# Patient Record
Sex: Female | Born: 1975 | Race: White | Hispanic: No | Marital: Married | Smoking: Never smoker
Health system: Southern US, Community
[De-identification: ages and names within clinical notes are randomized; demographics above are authoritative.]

## PROBLEM LIST (undated history)

## (undated) DIAGNOSIS — I10 Essential (primary) hypertension: Secondary | ICD-10-CM

## (undated) DIAGNOSIS — T7840XA Allergy, unspecified, initial encounter: Secondary | ICD-10-CM

## (undated) HISTORY — DX: Allergy, unspecified, initial encounter: T78.40XA

## (undated) HISTORY — DX: Essential (primary) hypertension: I10

---

## 1997-10-27 ENCOUNTER — Encounter: Admission: RE | Admit: 1997-10-27 | Discharge: 1998-01-25 | Payer: Self-pay | Admitting: Obstetrics and Gynecology

## 1998-03-10 ENCOUNTER — Emergency Department (HOSPITAL_COMMUNITY): Admission: EM | Admit: 1998-03-10 | Discharge: 1998-03-10 | Payer: Self-pay | Admitting: Emergency Medicine

## 1998-11-13 ENCOUNTER — Ambulatory Visit (HOSPITAL_COMMUNITY): Admission: RE | Admit: 1998-11-13 | Discharge: 1998-11-13 | Payer: Self-pay | Admitting: Obstetrics and Gynecology

## 1998-11-20 ENCOUNTER — Ambulatory Visit (HOSPITAL_COMMUNITY): Admission: RE | Admit: 1998-11-20 | Discharge: 1998-11-20 | Payer: Self-pay | Admitting: Obstetrics and Gynecology

## 2003-04-21 ENCOUNTER — Other Ambulatory Visit: Admission: RE | Admit: 2003-04-21 | Discharge: 2003-04-21 | Payer: Self-pay | Admitting: Obstetrics and Gynecology

## 2004-04-27 ENCOUNTER — Other Ambulatory Visit: Admission: RE | Admit: 2004-04-27 | Discharge: 2004-04-27 | Payer: Self-pay | Admitting: Obstetrics and Gynecology

## 2005-05-13 ENCOUNTER — Other Ambulatory Visit: Admission: RE | Admit: 2005-05-13 | Discharge: 2005-05-13 | Payer: Self-pay | Admitting: Obstetrics and Gynecology

## 2006-06-27 ENCOUNTER — Other Ambulatory Visit: Admission: RE | Admit: 2006-06-27 | Discharge: 2006-06-27 | Payer: Self-pay | Admitting: Obstetrics and Gynecology

## 2006-12-31 ENCOUNTER — Inpatient Hospital Stay (HOSPITAL_COMMUNITY): Admission: AD | Admit: 2006-12-31 | Discharge: 2006-12-31 | Payer: Self-pay | Admitting: Obstetrics and Gynecology

## 2007-01-11 ENCOUNTER — Inpatient Hospital Stay (HOSPITAL_COMMUNITY): Admission: AD | Admit: 2007-01-11 | Discharge: 2007-01-12 | Payer: Self-pay | Admitting: Obstetrics and Gynecology

## 2007-01-15 ENCOUNTER — Inpatient Hospital Stay (HOSPITAL_COMMUNITY): Admission: AD | Admit: 2007-01-15 | Discharge: 2007-01-17 | Payer: Self-pay | Admitting: Obstetrics and Gynecology

## 2012-05-04 ENCOUNTER — Ambulatory Visit (INDEPENDENT_AMBULATORY_CARE_PROVIDER_SITE_OTHER): Payer: 59 | Admitting: General Surgery

## 2012-05-04 ENCOUNTER — Encounter (INDEPENDENT_AMBULATORY_CARE_PROVIDER_SITE_OTHER): Payer: Self-pay | Admitting: General Surgery

## 2012-05-04 VITALS — BP 112/80 | HR 67 | Temp 98.4°F | Resp 14 | Ht 68.0 in | Wt 163.4 lb

## 2012-05-04 DIAGNOSIS — K645 Perianal venous thrombosis: Secondary | ICD-10-CM

## 2012-05-04 HISTORY — DX: Perianal venous thrombosis: K64.5

## 2012-05-04 NOTE — Patient Instructions (Signed)
Use colace or miralax for constipation Warm tub soaks  Alternate tucks pads and ice packs to area

## 2012-05-17 ENCOUNTER — Encounter (INDEPENDENT_AMBULATORY_CARE_PROVIDER_SITE_OTHER): Payer: Self-pay | Admitting: General Surgery

## 2012-05-17 NOTE — Progress Notes (Signed)
Subjective:     Patient ID: Tami Johnson, female   DOB: [birth year], 36 y.o.   MRN: [id number]  HPI We are asked to see the patient in consultation by Dr. Christell Constant to evaluate her for hemorrhoids. The patient is a 36 year old female who has been experiencing pain at her rectum for the last week and a half. She has noticed a hard knot in the area. She denies any bleeding with her bowel movements. She does note that she has hard stools frequently.  Review of Systems  Constitutional: Negative.   HENT: Negative.   Eyes: Negative.   Respiratory: Negative.   Cardiovascular: Negative.   Gastrointestinal: Positive for rectal pain.  Genitourinary: Negative.   Musculoskeletal: Negative.   Skin: Negative.   Neurological: Negative.   Hematological: Negative.   Psychiatric/Behavioral: Negative.        Objective:   Physical Exam  Constitutional: She is oriented to person, place, and time. She appears well-developed and well-nourished.  HENT:  Head: Normocephalic and atraumatic.  Eyes: Conjunctivae and EOM are normal. Pupils are equal, round, and reactive to light.  Neck: Normal range of motion. Neck supple.  Cardiovascular: Normal rate, regular rhythm and normal heart sounds.   Pulmonary/Chest: Effort normal and breath sounds normal.  Abdominal: Soft. Bowel sounds are normal.  Genitourinary:       She does have what appears to be a resolving thrombosed external hemorrhoid. There is no skin breakdown.  Musculoskeletal: Normal range of motion.  Neurological: She is alert and oriented to person, place, and time.  Skin: Skin is warm and dry.  Psychiatric: She has a normal mood and affect. Her behavior is normal.       Assessment:     The patient has what appears to be a resolving thrombosed external hemorrhoid.    Plan:     At this point I think the clot we'll continue to break down and the hemorrhoid resolve on its own. I do not think there is any surgical intervention needed for this.  I do think fixing her constipation problems may help keep this from coming back. I recommended MiraLAX for constipation. I've also recommended alternating ice packs and tucked to the area to try to help the hemorrhoid resolve. We will plan to see her back on a when necessary basis

## 2012-06-28 ENCOUNTER — Other Ambulatory Visit: Payer: Self-pay | Admitting: Obstetrics and Gynecology

## 2012-06-28 DIAGNOSIS — N63 Unspecified lump in unspecified breast: Secondary | ICD-10-CM

## 2012-07-03 ENCOUNTER — Ambulatory Visit
Admission: RE | Admit: 2012-07-03 | Discharge: 2012-07-03 | Disposition: A | Discharge status: Home / Self Care | Payer: 59 | Source: Ambulatory Visit | Attending: Obstetrics and Gynecology | Admitting: Obstetrics and Gynecology

## 2012-07-03 DIAGNOSIS — N63 Unspecified lump in unspecified breast: Secondary | ICD-10-CM

## 2013-01-08 ENCOUNTER — Telehealth: Payer: Self-pay | Admitting: Nurse Practitioner

## 2013-01-08 NOTE — Telephone Encounter (Signed)
Advised patient that due to no appts being available she should go to urgent care

## 2013-01-09 ENCOUNTER — Ambulatory Visit (INDEPENDENT_AMBULATORY_CARE_PROVIDER_SITE_OTHER): Payer: 59 | Admitting: Family Medicine

## 2013-01-09 ENCOUNTER — Ambulatory Visit (INDEPENDENT_AMBULATORY_CARE_PROVIDER_SITE_OTHER): Payer: 59

## 2013-01-09 ENCOUNTER — Encounter: Payer: Self-pay | Admitting: Family Medicine

## 2013-01-09 VITALS — BP 136/82 | HR 65 | Temp 98.7°F | Wt 162.2 lb

## 2013-01-09 DIAGNOSIS — S60221A Contusion of right hand, initial encounter: Secondary | ICD-10-CM

## 2013-01-09 DIAGNOSIS — M79641 Pain in right hand: Secondary | ICD-10-CM

## 2013-01-09 DIAGNOSIS — M79609 Pain in unspecified limb: Secondary | ICD-10-CM

## 2013-01-09 DIAGNOSIS — S60229A Contusion of unspecified hand, initial encounter: Secondary | ICD-10-CM

## 2013-01-09 NOTE — Patient Instructions (Signed)
Elevate hand as much as possible. Ice for the rest of the day. Moist heat starting tomorrow Avoid any heavy lifting or straining Take ibuprofen if needed for pain

## 2013-01-09 NOTE — Progress Notes (Signed)
  Subjective:    Patient ID: Tami Johnson, female    DOB: 1975-10-23, 37 y.o.   MRN: [id number]  HPI Patient hit washing machine with her right fist two nites ago. She has a large bruise over the dorsal surface of the right hand especially  proximal to the third and fourth fingers. The area is slightly swollen. It has been painful to flex and extend the fingers of this hand.  Review of Systems  Musculoskeletal: Positive for arthralgias (R hand pain).  Skin: Positive for color change (bruising).       Objective:   Physical Exam Swelling and bruising of right dorsal hand [facility] reading (PRIMARY) by  Dr. Vernon Prey  normal x-rays of right hand                                    Assessment & Plan:  Contusion of right hand  Plan: Patient Instructions  Elevate hand as much as possible. Ice for the rest of the day. Moist heat starting tomorrow Avoid any heavy lifting or straining Take ibuprofen if needed for pain

## 2013-08-20 ENCOUNTER — Ambulatory Visit (INDEPENDENT_AMBULATORY_CARE_PROVIDER_SITE_OTHER): Payer: 59 | Admitting: Family Medicine

## 2013-08-20 ENCOUNTER — Encounter: Payer: Self-pay | Admitting: Family Medicine

## 2013-08-20 VITALS — BP 128/81 | HR 99 | Temp 98.9°F | Ht 68.0 in | Wt 168.0 lb

## 2013-08-20 DIAGNOSIS — J029 Acute pharyngitis, unspecified: Secondary | ICD-10-CM

## 2013-08-20 MED ORDER — AZITHROMYCIN 250 MG PO TABS: ORAL_TABLET | ORAL | Status: DC

## 2013-08-20 NOTE — Patient Instructions (Signed)

## 2013-08-20 NOTE — Progress Notes (Signed)
  Subjective:    Patient ID: Tami Johnson, female    DOB: [birth year], 37 y.o.   MRN: [id number]  HPI This 37 y.o. female presents for evaluation of sinus drainage and uri sx's for over a week.   Review of Systems    No chest pain, SOB, HA, dizziness, vision change, N/V, diarrhea, constipation, dysuria, urinary urgency or frequency, myalgias, arthralgias or rash.  Objective:   Physical Exam  Vital signs noted  Well developed well nourished female.  HEENT - Head atraumatic Normocephalic                Eyes - PERRLA, Conjuctiva - clear Sclera- Clear EOMI                Ears - EAC's Wnl TM's Wnl Gross Hearing WNL                Nose - Nares patent                 Throat - oropharanx wnl Respiratory - Lungs CTA bilateral Cardiac - RRR S1 and S2 without murmur GI - Abdomen soft Nontender and bowel sounds active x 4 Extremities - No edema. Neuro - Grossly intact.      Assessment & Plan:  Acute pharyngitis - Plan: azithromycin (ZITHROMAX) 250 MG tablet WSWG's, tylenol and motrin otc as directed. Push po fluids, rest, and follow up prn.  Deatra Canter FNP

## 2013-10-27 DIAGNOSIS — N63 Unspecified lump in unspecified breast: Secondary | ICD-10-CM | POA: Insufficient documentation

## 2013-11-04 ENCOUNTER — Other Ambulatory Visit: Payer: Self-pay | Admitting: Obstetrics and Gynecology

## 2013-11-04 DIAGNOSIS — N644 Mastodynia: Secondary | ICD-10-CM

## 2013-11-04 DIAGNOSIS — N6320 Unspecified lump in the left breast, unspecified quadrant: Principal | ICD-10-CM

## 2013-11-04 DIAGNOSIS — N6325 Unspecified lump in the left breast, overlapping quadrants: Secondary | ICD-10-CM

## 2013-11-11 ENCOUNTER — Ambulatory Visit
Admission: RE | Admit: 2013-11-11 | Discharge: 2013-11-11 | Disposition: A | Discharge status: Home / Self Care | Payer: 59 | Source: Ambulatory Visit | Attending: Obstetrics and Gynecology | Admitting: Obstetrics and Gynecology

## 2013-11-11 ENCOUNTER — Other Ambulatory Visit: Payer: Self-pay | Admitting: Obstetrics and Gynecology

## 2013-11-11 DIAGNOSIS — N6325 Unspecified lump in the left breast, overlapping quadrants: Secondary | ICD-10-CM

## 2013-11-11 DIAGNOSIS — N644 Mastodynia: Secondary | ICD-10-CM

## 2013-11-11 DIAGNOSIS — N6320 Unspecified lump in the left breast, unspecified quadrant: Principal | ICD-10-CM

## 2014-01-28 ENCOUNTER — Encounter: Payer: Self-pay | Admitting: Family Medicine

## 2014-01-28 ENCOUNTER — Ambulatory Visit (INDEPENDENT_AMBULATORY_CARE_PROVIDER_SITE_OTHER): Payer: 59 | Admitting: Family Medicine

## 2014-01-28 VITALS — BP 131/87 | HR 82 | Temp 98.2°F | Wt 166.8 lb

## 2014-01-28 DIAGNOSIS — J209 Acute bronchitis, unspecified: Secondary | ICD-10-CM

## 2014-01-28 MED ORDER — METHYLPREDNISOLONE ACETATE 80 MG/ML IJ SUSP
80.0000 mg | Freq: Once | INTRAMUSCULAR | Status: AC
  Administered 2014-01-28: 80 mg via INTRAMUSCULAR

## 2014-01-28 MED ORDER — BENZONATATE 100 MG PO CAPS
100.0000 mg | ORAL_CAPSULE | Freq: Three times a day (TID) | ORAL | Status: DC | PRN
Start: 2014-01-28 — End: 2014-08-25

## 2014-01-28 MED ORDER — AMOXICILLIN 875 MG PO TABS: 875.0000 mg | ORAL_TABLET | Freq: Two times a day (BID) | ORAL | Status: DC

## 2014-01-28 NOTE — Progress Notes (Signed)
   Subjective:    Patient ID: Tami Johnson, female    DOB: Sep 03, 1976, 38 y.o.   MRN: [id number]  HPI This 38 y.o. female presents for evaluation of cough and uri sx's for over a week.   Review of Systems C/o cough and uri sx's No chest pain, SOB, HA, dizziness, vision change, N/V, diarrhea, constipation, dysuria, urinary urgency or frequency, myalgias, arthralgias or rash.     Objective:   Physical Exam Vital signs noted  Well developed well nourished female.  HEENT - Head atraumatic Normocephalic                Eyes - PERRLA, Conjuctiva - clear Sclera- Clear EOMI                Ears - EAC's Wnl TM's Wnl Gross Hearing WNL                Throat - oropharanx wnl Respiratory - Lungs CTA bilateral Cardiac - RRR S1 and S2 without murmur GI - Abdomen soft Nontender and bowel sounds active x 4 Extremities - No edema. Neuro - Grossly intact.       Assessment & Plan:  Acute bronchitis - Plan: amoxicillin (AMOXIL) 875 MG tablet, benzonatate (TESSALON PERLES) 100 MG capsule, methylPREDNISolone acetate (DEPO-MEDROL) injection 80 mg  Push po fluids, rest, tylenol and motrin otc prn as directed for fever, arthralgias, and myalgias.  Follow up prn if sx's continue or persist.  Deatra CanterWilliam J Oxford FNP

## 2014-01-31 ENCOUNTER — Telehealth: Payer: Self-pay | Admitting: Family Medicine

## 2014-01-31 ENCOUNTER — Other Ambulatory Visit: Payer: Self-pay | Admitting: Family Medicine

## 2014-01-31 MED ORDER — FLUTICASONE PROPIONATE 50 MCG/ACT NA SUSP
2.0000 | Freq: Every day | NASAL | Status: DC
Start: 2014-01-31 — End: 2014-09-03

## 2014-01-31 NOTE — Telephone Encounter (Signed)
Sent flonase ns into pharmacy

## 2014-01-31 NOTE — Telephone Encounter (Signed)
Left message that Flonase was sent into pharmacy

## 2014-01-31 NOTE — Telephone Encounter (Signed)
flonase NS sent to pharmacy

## 2014-08-25 ENCOUNTER — Encounter: Payer: Self-pay | Admitting: *Deleted

## 2014-08-25 ENCOUNTER — Encounter: Payer: Self-pay | Admitting: Cardiology

## 2014-08-25 ENCOUNTER — Ambulatory Visit (INDEPENDENT_AMBULATORY_CARE_PROVIDER_SITE_OTHER): Payer: 59 | Admitting: Cardiology

## 2014-08-25 VITALS — BP 132/96 | HR 73 | Ht 68.0 in | Wt 172.0 lb

## 2014-08-25 DIAGNOSIS — R002 Palpitations: Secondary | ICD-10-CM

## 2014-08-25 DIAGNOSIS — I491 Atrial premature depolarization: Secondary | ICD-10-CM

## 2014-08-25 DIAGNOSIS — I10 Essential (primary) hypertension: Secondary | ICD-10-CM

## 2014-08-25 DIAGNOSIS — K645 Perianal venous thrombosis: Secondary | ICD-10-CM

## 2014-08-25 MED ORDER — METOPROLOL SUCCINATE ER 25 MG PO TB24: ORAL_TABLET | ORAL | Status: DC

## 2014-08-25 NOTE — Patient Instructions (Signed)
START TOPROL XL 25 MG 1/2 TABLET DAILY, RX SENT TO [facility]   AVOID CAFFEINE  AVOID SALT/SODIUM  Get OMRON blood pressure cuff and start monitoring blood pressure   A chest x-ray takes a picture of the organs and structures inside the chest, including the heart, lungs, and blood vessels. This test can show several things, including, whether the heart is enlarges; whether fluid is building up in the lungs; and whether pacemaker / defibrillator leads are still in place. [facility]  Your physician has requested that you have an echocardiogram. Echocardiography is a painless test that uses sound waves to create images of your heart. It provides your doctor with information about the size and shape of your heart and how well your heart's chambers and valves are working. This procedure takes approximately one hour. There are no restrictions for this procedure.  Your physician recommends that you schedule a follow-up appointment in: 1 MONTH OV

## 2014-08-25 NOTE — Progress Notes (Signed)
Tami LeydenKristal L Reesor Date of Birth:  24-Sep-1976 [facility] [address] [place], [place]  [id number] [phone]        Fax   [phone]   History of Present Illness: This pleasant 38 year old married woman is seen by me for the first time today.  She is seen at the request of Eveline KetoKathy Harris NP.  The patient is being seen because of the recent discovery of an irregular heartbeat.  This was noted during a routine GYN exam.  The patient herself has noted occasional palpitations but has not paid much attention to them.  She does note some occasional shortness of breath.  She has not been experiencing any chest discomfort.  She has had some mild dizziness which she attributes to a chronic ear condition.  For the past month she has been on a new oral contraceptive and wonders if that could be contributing to her dizziness.  The patient does not drink any alcohol.  She does not smoke.  She does drink an occasional diet Dr. Reino KentPepper which contains caffeine. Her family history reveals that her father has a history of high blood pressure but is alive at 5363.  Her mother is alive at 6861 and has no heart problems. Past medical history reveals that showing her first pregnancy the patient had high blood pressure as well as gestational diabetes and had significant weight gain.  She did not have any blood pressure problems with her second pregnancy.  Current Outpatient Prescriptions  Medication Sig Dispense Refill  . fluconazole (DIFLUCAN) 150 MG tablet Take 150 mg by mouth. Takes one every two weeks.    . fluticasone (FLONASE) 50 MCG/ACT nasal spray Place 2 sprays into both nostrils daily. 16 g 6  . FLUVIRIN 0.5 ML SUSY   0  . SPRINTEC 28 0.25-35 MG-MCG tablet     . metoprolol succinate (TOPROL XL) 25 MG 24 hr tablet 1/2 tablet daily 45 tablet 1   No current facility-administered medications for this visit.    Allergies  Allergen Reactions  . Ciprofloxacin Hives  . Sulfa Antibiotics  Hives    Patient Active Problem List   Diagnosis Date Noted  . PAC (premature atrial contraction) 08/25/2014  . Essential hypertension 08/25/2014  . Intermittent palpitations 08/25/2014  . External hemorrhoid, thrombosed 05/04/2012    History  Smoking status  . Never Smoker   Smokeless tobacco  . Not on file    History  Alcohol Use No    No family history on file.  Review of Systems: Constitutional: no fever chills diaphoresis or fatigue or change in weight.  Head and neck: no hearing loss, no epistaxis, no photophobia or visual disturbance. Respiratory: No cough, shortness of breath or wheezing. Cardiovascular: No chest pain peripheral edema, palpitations. Gastrointestinal: No abdominal distention, no abdominal pain, no change in bowel habits hematochezia or melena. Genitourinary: No dysuria, no frequency, no urgency, no nocturia. Musculoskeletal:No arthralgias, no back pain, no gait disturbance or myalgias. Neurological: No dizziness, no headaches, no numbness, no seizures, no syncope, no weakness, no tremors. Hematologic: No lymphadenopathy, no easy bruising. Psychiatric: No confusion, no hallucinations, no sleep disturbance.   Wt Readings from Last 3 Encounters:  08/25/14 172 lb (78.019 kg)  01/28/14 166 lb 12.8 oz (75.66 kg)  08/20/13 168 lb (76.204 kg)    Physical Exam: Filed Vitals:   08/25/14 1450  BP: 132/96  Pulse: 73   The patient appears to be in no distress.  Head and  neck exam reveals that the pupils are equal and reactive.  The extraocular movements are full.  There is no scleral icterus.  Mouth and pharynx are benign.  No lymphadenopathy.  No carotid bruits.  The jugular venous pressure is normal.  Thyroid is not enlarged or tender.  Chest is clear to percussion and auscultation.  No rales or rhonchi.  Expansion of the chest is symmetrical.  Heart reveals no abnormal lift or heave.  First and second heart sounds are normal.  There is no murmur  gallop rub or click.  Auscultation reveals occasional immature atrial beat.  The abdomen is soft and nontender.  Bowel sounds are normoactive.  There is no hepatosplenomegaly or mass.  There are no abdominal bruits.  Extremities reveal no phlebitis or edema.  Pedal pulses are good.  There is no cyanosis or clubbing.  Neurologic exam is normal strength and no lateralizing weakness.  No sensory deficits.  Integument reveals no rash  EKG shows normal sinus rhythm with occasional premature atrial beat, otherwise normal  Assessment / Plan: 1.  Sporadic premature atrial beats, minimally symptomatic. 2.  Mild hypertension  Disposition: For her blood pressure we have advised avoidance of salt and getting regular exercise.  We will also start her on a low dose of metoprolol succinate 12.5 mg daily.  She will obtain an blood pressure cuff for home use. In regard to her premature atrial beats, these do not in and of themselves require treatment.  However, the beta blocker that we put her on for her blood pressure should also help suppress the PACs.  To rule out any significant underlying structural disease we will get a chest x-ray and a 2-D echocardiogram. I have asked the patient to return in about a month for follow-up office visit. Many thanks for the opportunity to see this pleasant woman with you.  I will be in touch regarding the results of her studies.

## 2014-08-28 ENCOUNTER — Ambulatory Visit (INDEPENDENT_AMBULATORY_CARE_PROVIDER_SITE_OTHER): Payer: 59 | Admitting: Family Medicine

## 2014-08-28 VITALS — BP 142/80 | HR 71 | Temp 99.0°F | Ht 68.0 in | Wt 170.0 lb

## 2014-08-28 DIAGNOSIS — J069 Acute upper respiratory infection, unspecified: Secondary | ICD-10-CM

## 2014-08-28 MED ORDER — AZITHROMYCIN 250 MG PO TABS: ORAL_TABLET | ORAL | Status: DC

## 2014-08-28 MED ORDER — MOMETASONE FUROATE 50 MCG/ACT NA SUSP: 2.0000 | Freq: Every day | NASAL | Status: DC

## 2014-08-28 NOTE — Progress Notes (Signed)
   Subjective:    Patient ID: Tami Johnson, female    DOB: [birth year], 38 y.o.   MRN: [id number]  HPI Patient c/o ear discomfort and pain.  Review of Systems  Constitutional: Negative for fever.  HENT: Negative for ear pain.   Eyes: Negative for discharge.  Respiratory: Negative for cough.   Cardiovascular: Negative for chest pain.  Gastrointestinal: Negative for abdominal distention.  Endocrine: Negative for polyuria.  Genitourinary: Negative for difficulty urinating.  Musculoskeletal: Negative for gait problem and neck pain.  Skin: Negative for color change and rash.  Neurological: Negative for speech difficulty and headaches.  Psychiatric/Behavioral: Negative for agitation.       Objective:    BP 142/80 mmHg  Pulse 71  Temp(Src) 99 F (37.2 C) (Oral)  Ht 5\' 8"  (1.727 m)  Wt 170 lb (77.111 kg)  BMI 25.85 kg/m2 Physical Exam  Constitutional: She is oriented to person, place, and time. She appears well-developed and well-nourished.  HENT:  Head: Normocephalic and atraumatic.  Mouth/Throat: Oropharynx is clear and moist.  Eyes: Pupils are equal, round, and reactive to light.  Neck: Normal range of motion. Neck supple.  Cardiovascular: Normal rate and regular rhythm.   No murmur heard. Pulmonary/Chest: Effort normal and breath sounds normal.  Abdominal: Soft. Bowel sounds are normal. There is no tenderness.  Neurological: She is alert and oriented to person, place, and time.  Skin: Skin is warm and dry.  Psychiatric: She has a normal mood and affect.          Assessment & Plan:     ICD-9-CM ICD-10-CM   1. URI (upper respiratory infection) 465.9 J06.9 mometasone (NASONEX) 50 MCG/ACT nasal spray     azithromycin (ZITHROMAX) 250 MG tablet     No Follow-up on file.  Deatra CanterWilliam J Adalyne Lovick FNP

## 2014-08-29 ENCOUNTER — Ambulatory Visit (HOSPITAL_COMMUNITY): Payer: 59 | Attending: Cardiology | Admitting: Cardiology

## 2014-08-29 ENCOUNTER — Ambulatory Visit
Admission: RE | Admit: 2014-08-29 | Discharge: 2014-08-29 | Disposition: A | Discharge status: Home / Self Care | Payer: 59 | Source: Ambulatory Visit | Attending: Cardiology | Admitting: Cardiology

## 2014-08-29 ENCOUNTER — Telehealth: Payer: Self-pay | Admitting: Cardiology

## 2014-08-29 ENCOUNTER — Encounter: Payer: Self-pay | Admitting: *Deleted

## 2014-08-29 DIAGNOSIS — I10 Essential (primary) hypertension: Secondary | ICD-10-CM

## 2014-08-29 DIAGNOSIS — I491 Atrial premature depolarization: Secondary | ICD-10-CM | POA: Insufficient documentation

## 2014-08-29 DIAGNOSIS — R002 Palpitations: Secondary | ICD-10-CM | POA: Diagnosis not present

## 2014-08-29 NOTE — Progress Notes (Signed)
Echo performed. 

## 2014-08-29 NOTE — Telephone Encounter (Signed)
Left message to call back  

## 2014-08-29 NOTE — Telephone Encounter (Signed)
New Message       Pt calling stating she needs a note for work from her appt today. Pt needs one for herself and one for herself because her husband came with her to her appt. Please fax to [phone].

## 2014-09-01 NOTE — Telephone Encounter (Signed)
Follow up  ° ° ° °Returning call back to nurse  °

## 2014-09-01 NOTE — Telephone Encounter (Signed)
Faxed as requested

## 2014-09-03 ENCOUNTER — Other Ambulatory Visit: Payer: Self-pay | Admitting: Family Medicine

## 2014-09-03 ENCOUNTER — Telehealth: Payer: Self-pay | Admitting: *Deleted

## 2014-09-03 MED ORDER — FLUTICASONE PROPIONATE 50 MCG/ACT NA SUSP: 2.0000 | Freq: Every day | NASAL | Status: DC

## 2014-09-03 NOTE — Telephone Encounter (Signed)
flonase ns sent to pharm

## 2014-09-03 NOTE — Telephone Encounter (Signed)
Bill, ins will not cover nasonex spray unless she has tried flunisolide, nasocort otc or zetonna.If you will have her try one of these if it doesn't work I will resubmit the nasonex.

## 2014-09-08 ENCOUNTER — Telehealth: Payer: Self-pay | Admitting: Cardiology

## 2014-09-08 NOTE — Telephone Encounter (Signed)
Advised patient of echo  Patient stated that since having echo she has been having dizziness/lightheadedness when she lays on her left side, rolls over and subsides. Discussed with  Dr. Patty SermonsBrackbill and patient should follow up with PCP Advised patient

## 2014-09-08 NOTE — Telephone Encounter (Signed)
-----   Message from Cassell Clementhomas Brackbill, MD sent at 09/07/2014 10:03 PM EST ----- The echo is normal.  Please report.

## 2014-09-08 NOTE — Telephone Encounter (Signed)
New message      Pt want her test results.  Pt feels dizzy and lightheaded when she lies on her left side.

## 2014-09-22 ENCOUNTER — Ambulatory Visit (INDEPENDENT_AMBULATORY_CARE_PROVIDER_SITE_OTHER): Payer: 59 | Admitting: Cardiology

## 2014-09-22 ENCOUNTER — Encounter: Payer: Self-pay | Admitting: Cardiology

## 2014-09-22 VITALS — BP 142/90 | HR 68 | Ht 68.0 in | Wt 171.0 lb

## 2014-09-22 DIAGNOSIS — I491 Atrial premature depolarization: Secondary | ICD-10-CM

## 2014-09-22 DIAGNOSIS — I10 Essential (primary) hypertension: Secondary | ICD-10-CM

## 2014-09-22 DIAGNOSIS — R002 Palpitations: Secondary | ICD-10-CM

## 2014-09-22 MED ORDER — METOPROLOL SUCCINATE ER 25 MG PO TB24: 25.0000 mg | ORAL_TABLET | Freq: Every day | ORAL | Status: DC

## 2014-09-22 NOTE — Progress Notes (Signed)
Tami LeydenKristal L Johnson Date of Birth:  Oct 28, 1975 [facility] [address] [place], [place]  [id number] [phone]        Fax   [phone]   History of Present Illness: This pleasant 38 year old married woman is seen for a follow-up office visit.  She was initially seen at the request of Eveline KetoKathy Harris NP.  The patient was seen because of the recent discovery of an irregular heartbeat.  This was noted during a routine GYN exam.  The patient herself has noted occasional palpitations but has not paid much attention to them.  She does note some occasional shortness of breath.  She has not been experiencing any chest discomfort.  She has had some mild dizziness which she attributes to a chronic ear condition.  For the past month she has been on a new oral contraceptive and wonders if that could be contributing to her dizziness.  The patient does not drink any alcohol.  She does not smoke.  She does drink an occasional diet Dr. Reino KentPepper which contains caffeine. Her family history reveals that her father has a history of high blood pressure but is alive at 5763.  Her mother is alive at 461 and has no heart problems. Past medical history reveals that showing her first pregnancy the patient had high blood pressure as well as gestational diabetes and had significant weight gain.  She did not have any blood pressure problems with her second pregnancy. Since her last visit the patient underwent echocardiogram on 08/29/14 which was normal and showed an ejection fraction of 55-60%. She also had a chest x-ray on 08/29/2014 showing normal heart size and clear lungs.  Current Outpatient Prescriptions  Medication Sig Dispense Refill  . fluconazole (DIFLUCAN) 100 MG tablet Take 100 mg by mouth daily.    Marland Kitchen. guaiFENesin (MUCINEX) 600 MG 12 hr tablet 600 mg 2 (two) times daily as needed.    . metoprolol succinate (TOPROL XL) 25 MG 24 hr tablet Take 1 tablet (25 mg total) by mouth daily. 90 tablet 3  .  SPRINTEC 28 0.25-35 MG-MCG tablet      No current facility-administered medications for this visit.    Allergies  Allergen Reactions  . Ciprofloxacin Hives  . Sulfa Antibiotics Hives    Patient Active Problem List   Diagnosis Date Noted  . PAC (premature atrial contraction) 08/25/2014  . Essential hypertension 08/25/2014  . Intermittent palpitations 08/25/2014  . External hemorrhoid, thrombosed 05/04/2012    History  Smoking status  . Never Smoker   Smokeless tobacco  . Not on file    History  Alcohol Use No    History reviewed. No pertinent family history.  Review of Systems: Constitutional: no fever chills diaphoresis or fatigue or change in weight.  Head and neck: no hearing loss, no epistaxis, no photophobia or visual disturbance. Respiratory: No cough, shortness of breath or wheezing. Cardiovascular: No chest pain peripheral edema, palpitations. Gastrointestinal: No abdominal distention, no abdominal pain, no change in bowel habits hematochezia or melena. Genitourinary: No dysuria, no frequency, no urgency, no nocturia. Musculoskeletal:No arthralgias, no back pain, no gait disturbance or myalgias. Neurological: No dizziness, no headaches, no numbness, no seizures, no syncope, no weakness, no tremors. Hematologic: No lymphadenopathy, no easy bruising. Psychiatric: No confusion, no hallucinations, no sleep disturbance.   Wt Readings from Last 3 Encounters:  09/22/14 171 lb (77.565 kg)  08/28/14 170 lb (77.111 kg)  08/25/14 172 lb (78.019 kg)    Physical  Exam: Filed Vitals:   09/22/14 1525  BP: 142/90  Pulse: 68   The patient appears to be in no distress.  Head and neck exam reveals that the pupils are equal and reactive.  The extraocular movements are full.  There is no scleral icterus.  Mouth and pharynx are benign.  No lymphadenopathy.  No carotid bruits.  The jugular venous pressure is normal.  Thyroid is not enlarged or tender.  Chest is clear to  percussion and auscultation.  No rales or rhonchi.  Expansion of the chest is symmetrical.  Heart reveals no abnormal lift or heave.  First and second heart sounds are normal.  There is no murmur gallop rub or click.  Auscultation reveals occasional immature atrial beat.  The abdomen is soft and nontender.  Bowel sounds are normoactive.  There is no hepatosplenomegaly or mass.  There are no abdominal bruits.  Extremities reveal no phlebitis or edema.  Pedal pulses are good.  There is no cyanosis or clubbing.  Neurologic exam is normal strength and no lateralizing weakness.  No sensory deficits.  Integument reveals no rash  EKG shows normal sinus rhythm with occasional premature atrial beat, otherwise normal  Assessment / Plan: 1.  Sporadic premature atrial beats, minimally symptomatic. 2.  Mild hypertension, improved  Disposition: The patient brought in a list of her daily blood pressures which have improved.  Her diastolic blood pressure today is slightly high.  We will increase her Toprol to a full 25 mg tablet daily. She is having pain in the right ear.  I looked in her ear.  The eardrum is not red.  She will try Mucinex plain for head congestion.  She has an appointment on December 30 at her PCP to evaluate further. Recheck here in 6 months for follow-up office visit

## 2014-09-22 NOTE — Patient Instructions (Signed)
INCREASE YOUR TOPROL XL 25 MG TO ONE DAILY   Try Mucinex plain over the counter twice a day as needed for congestion  Your physician wants you to follow-up in: 6 MONTH OV You will receive a reminder letter in the mail two months in advance. If you don't receive a letter, please call our office to schedule the follow-up appointment.

## 2015-02-16 ENCOUNTER — Encounter: Payer: Self-pay | Admitting: Family Medicine

## 2015-02-16 ENCOUNTER — Telehealth: Payer: Self-pay | Admitting: Gastroenterology

## 2015-02-16 ENCOUNTER — Ambulatory Visit (INDEPENDENT_AMBULATORY_CARE_PROVIDER_SITE_OTHER): Payer: 59 | Admitting: Family

## 2015-02-16 ENCOUNTER — Encounter (INDEPENDENT_AMBULATORY_CARE_PROVIDER_SITE_OTHER): Payer: Self-pay

## 2015-02-16 ENCOUNTER — Encounter: Payer: Self-pay | Admitting: Family

## 2015-02-16 VITALS — BP 140/74 | HR 77 | Temp 98.1°F | Resp 16 | Wt 172.0 lb

## 2015-02-16 DIAGNOSIS — K645 Perianal venous thrombosis: Secondary | ICD-10-CM | POA: Diagnosis not present

## 2015-02-16 MED ORDER — TRAMADOL HCL 50 MG PO TABS: 50.0000 mg | ORAL_TABLET | Freq: Three times a day (TID) | ORAL | Status: DC | PRN

## 2015-02-16 NOTE — Patient Instructions (Signed)
Hemorrhoid Banding Hemorrhoids are veins in the anus and lower rectum that become enlarged. The most common symptoms are rectal bleeding, itching, and sometimes pain. Hemorrhoids might come out with straining or having a bowel movement, and they can sometimes be pushed back in. There are internal and external hemorrhoids. Only internal hemorrhoids can be treated with banding. In this procedure, a rubber band is placed near the hemorrhoid tissue, cutting off the blood supply. This procedure prevents the hemorrhoids from slipping down. LET YOUR CAREGIVER KNOW ABOUT: All medicines you are taking, especially blood thinners such as aspirin and coumadin.  RISKS AND COMPLICATIONS This is not a painful procedure, but if you do have intense pain immediately let your surgeon know because the band may need to be removed. You may have some mild pain or discomfort in the first 2 days or so after treatment. Sometimes there may be delayed bleeding in the first week after treatment.  BEFORE THE PROCEDURE  There is no special preparation needed before banding. Your surgeon may have you do an enema prior to the procedure. You will go home the same day.  HOME CARE INSTRUCTIONS   Your surgeon might instruct you to do sitz baths as needed if you have discomfort or after a bowel movement.  You may be instructed to use fiber supplements. SEEK MEDICAL CARE IF:  You have an increase in pain.  Your pain does not get better. SEEK IMMEDIATE MEDICAL CARE IF:  You have intense pain.  Fever greater than 100.5 F (38.1 C).  Bleeding that does not stop, or pus from the anus. Document Released: 07/10/2009 Document Revised: 12/05/2011 Document Reviewed: 07/10/2009 [facility] Patient Information 2015 [place], [place]. This information is not intended to replace advice given to you by your health care provider. Make sure you discuss any questions you have with your health care provider. About Hemorrhoids  Hemorrhoids are  swollen veins in the lower rectum and anus.  Also called piles, hemorrhoids are a common problem.  Hemorrhoids may be internal (inside the rectum) or external (around the anus).  Internal Hemorrhoids  Internal hemorrhoids are often painless, but they rarely cause bleeding.  The internal veins may stretch and fall down (prolapse) through the anus to the outside of the body.  The veins may then become irritated and painful.  External Hemorrhoids  External hemorrhoids can be easily seen or felt around the anal opening.  They are under the skin around the anus.  When the swollen veins are scratched or broken by straining, rubbing or wiping they sometimes bleed.  How Hemorrhoids Occur  Veins in the rectum and around the anus tend to swell under pressure.  Hemorrhoids can result from increased pressure in the veins of your anus or rectum.  Some sources of pressure are:   Straining to have a bowel movement because of constipation  Waiting too long to have a bowel movement  Coughing and sneezing often  Sitting for extended periods of time, including on the toilet  Diarrhea  Obesity  Trauma or injury to the anus  Some liver diseases  Stress  Family history of hemorrhoids  Pregnancy  Pregnant women should try to avoid becoming constipated, because they are more likely to have hemorrhoids during pregnancy.  In the last trimester of pregnancy, the enlarged uterus may press on blood vessels and causes hemorrhoids.  In addition, the strain of childbirth sometimes causes hemorrhoids after the birth.  Symptoms of Hemorrhoids  Some symptoms of hemorrhoids include:  Swelling and/or  a tender lump around the anus  Itching, mild burning and bleeding around the anus  Painful bowel movements with or without constipation  Bright red blood covering the stool, on toilet paper or in the toilet bowel.   Symptoms usually go away within a few days.  Always talk to your doctor about any bleeding  to make sure it is not from some other causes.  Diagnosing and Treating Hemorrhoids  Diagnosis is made by an examination by your healthcare provider.  Special test can be performed by your doctor.    Most cases of hemorrhoids can be treated with:  High-fiber diet: Eat more high-fiber foods, which help prevent constipation.  Ask for more detailed fiber information on types and sources of fiber from your healthcare provider.  Fluids: Drink plenty of water.  This helps soften bowel movements so they are easier to pass.  Sitz baths and cold packs: Sitting in lukewarm water two or three times a day for 15 minutes cleases the anal area and may relieve discomfort.  If the water is too hot, swelling around the anus will get worse.  Placing a cloth-covered ice pack on the anus for ten minutes four times a day can also help reduce selling.  Gently pushing a prolapsed hemorrhoid back inside after the bath or ice pack can be helpful.  Medications: For mild discomfort, your healthcare provider may suggest over-the-counter pain medication or prescribe a cream or ointment for topical use.  The cream may contain witch hazel, zinc oxide or petroleum jelly.  Medicated suppositories are also a treatment option.  Always consult your doctor before applying medications or creams.  Procedures and surgeries: There are also a number of procedures and surgeries to shrink or remove hemorrhoids in more serious cases.  Talk to your physician about these options.  You can often prevent hemorrhoids or keep them from becoming worse by maintaining a healthy lifestyle.  Eat a fiber-rich diet of fruits, vegetables and whole grains.  Also, drink plenty of water and exercise regularly.   2007, [employer] Doc.30

## 2015-02-16 NOTE — Progress Notes (Signed)
   Subjective:    Patient ID: Tami Johnson, female    DOB: Jun 17, 1976, 39 y.o.   MRN: [id number]  HPI Pt presents to the office today with complaints of hemorrhoid that started Saturday. Pt states she has had these in past several times and has used hydrocortisone cream and suppositories with relief. Pt states she has been using these since Saturday with no relief. Pt states constant shooting pain of 10 out of 10. Pt states any straining makes it worse. Pt states she has taken Motrin with mild relief.     Review of Systems  Constitutional: Negative.   HENT: Negative.   Eyes: Negative.   Respiratory: Negative.  Negative for shortness of breath.   Cardiovascular: Negative.  Negative for palpitations.  Gastrointestinal: Negative.   Endocrine: Negative.   Genitourinary: Negative.   Musculoskeletal: Negative.   Neurological: Negative.  Negative for headaches.  Hematological: Negative.   Psychiatric/Behavioral: Negative.   All other systems reviewed and are negative.      Objective:   Physical Exam  Constitutional: She is oriented to person, place, and time. She appears well-developed and well-nourished. She appears distressed.  Pt grimacing and laying down related to pain  HENT:  Head: Normocephalic and atraumatic.  Eyes: Pupils are equal, round, and reactive to light.  Neck: Normal range of motion. Neck supple. No thyromegaly present.  Cardiovascular: Normal rate, regular rhythm, normal heart sounds and intact distal pulses.   No murmur heard. Pulmonary/Chest: Effort normal and breath sounds normal. No respiratory distress. She has no wheezes.  Abdominal: Soft. Bowel sounds are normal. She exhibits no distension. There is no tenderness.  Genitourinary: Rectal exam shows internal hemorrhoid (Grade 4) and tenderness.  Musculoskeletal: Normal range of motion. She exhibits no edema or tenderness.  Neurological: She is alert and oriented to person, place, and time. She has normal  reflexes. No cranial nerve deficit.  Skin: Skin is warm and dry.  Psychiatric: She has a normal mood and affect. Her behavior is normal. Judgment and thought content normal.  Vitals reviewed.   BP 140/74 mmHg  Pulse 77  Temp(Src) 98.1 F (36.7 C) (Oral)  Resp 16  Wt 172 lb (78.019 kg)       Assessment & Plan:  1. External hemorrhoid, thrombosed -Diet discussed -Continue suppositories - Avoid straining or pushing -RTO prn - Ambulatory referral to Gastroenterology- Sent urgently - traMADol (ULTRAM) 50 MG tablet; Take 1 tablet (50 mg total) by mouth every 8 (eight) hours as needed.  Dispense: 30 tablet; Refill: 0  Jannifer Rodneyhristy Jessen Siegman, FNP

## 2015-02-16 NOTE — Telephone Encounter (Signed)
Spoke with Eunice BlaseDebbie and let her know the pt needs to be referred to [facility].

## 2015-03-24 ENCOUNTER — Ambulatory Visit: Payer: Self-pay | Admitting: Cardiology

## 2015-04-09 ENCOUNTER — Ambulatory Visit (INDEPENDENT_AMBULATORY_CARE_PROVIDER_SITE_OTHER): Payer: BLUE CROSS/BLUE SHIELD | Admitting: Cardiology

## 2015-04-09 ENCOUNTER — Encounter: Payer: Self-pay | Admitting: Cardiology

## 2015-04-09 VITALS — BP 130/88 | HR 69 | Ht 68.0 in | Wt 172.0 lb

## 2015-04-09 DIAGNOSIS — I10 Essential (primary) hypertension: Secondary | ICD-10-CM

## 2015-04-09 DIAGNOSIS — I491 Atrial premature depolarization: Secondary | ICD-10-CM | POA: Diagnosis not present

## 2015-04-09 MED ORDER — METOPROLOL SUCCINATE ER 25 MG PO TB24: 25.0000 mg | ORAL_TABLET | Freq: Every day | ORAL | Status: DC

## 2015-04-09 NOTE — Patient Instructions (Signed)
Medication Instructions:  Your physician recommends that you continue on your current medications as directed. Please refer to the Current Medication list given to you today.  Labwork: NONE  Testing/Procedures: NONE  Follow-Up: Your physician wants you to follow-up in: 1 YEAR OV/EKG  You will receive a reminder letter in the mail two months in advance. If you don't receive a letter, please call our office to schedule the follow-up appointment.    

## 2015-04-09 NOTE — Progress Notes (Signed)
Cardiology Office Note   Date:  04/09/2015   ID:  Tami Johnson, DOB 03-04-1976, MRN [id number]  PCP:  Rudi Heap, MD  Cardiologist: Cassell Clement MD  Chief Complaint  Patient presents with  . HYPERTENSION      History of Present Illness: Tami Johnson is a 39 y.o. female who presents for 6 month follow-up office visit.  Marland Kitchen She was initially seen at the request of Eveline Keto NP. The patient was seen because of the recent discovery of an irregular heartbeat. This was noted during a routine GYN exam. The patient herself has noted occasional palpitations but has not paid much attention to them. She does note some occasional shortness of breath. She has not been experiencing any chest discomfort. She has not been having any dizziness.  No peripheral edema. Since we increased her dose of Toprol to a full 25 mg tablet each day her palpitations have improved further.. She works fixing hospital instruments.  She does not get much intentional exercise. Her family history reveals that her father has a history of high blood pressure but is alive at 21. Her mother is alive at 58 and has no heart problems. Past medical history reveals that showing her first pregnancy the patient had high blood pressure as well as gestational diabetes and had significant weight gain. She did not have any blood pressure problems with her second pregnancy. Since her last visit the patient underwent echocardiogram on 08/29/14 which was normal and showed an ejection fraction of 55-60%. She also had a chest x-ray on 08/29/2014 showing normal heart size and clear lungs.  History reviewed. No pertinent past medical history.  History reviewed. No pertinent past surgical history.   Current Outpatient Prescriptions  Medication Sig Dispense Refill  . fluconazole (DIFLUCAN) 100 MG tablet Take 100 mg by mouth daily.    Marland Kitchen ibuprofen (ADVIL,MOTRIN) 200 MG tablet Take 200 mg by mouth as needed.     .  metoprolol succinate (TOPROL XL) 25 MG 24 hr tablet Take 1 tablet (25 mg total) by mouth daily. 90 tablet 3  . SPRINTEC 28 0.25-35 MG-MCG tablet      No current facility-administered medications for this visit.    Allergies:   Ciprofloxacin; Sulfa antibiotics; and Sulfamethoxazole-trimethoprim    Social History:  The patient  reports that she has never smoked. She does not have any smokeless tobacco history on file. She reports that she does not drink alcohol or use illicit drugs.   Family History:  The patient's family history includes Hypertension in her father. There is no history of Heart attack or Stroke.    ROS:  Please see the history of present illness.   Otherwise, review of systems are positive for none.   All other systems are reviewed and negative.    PHYSICAL EXAM: VS:  BP 130/88 mmHg  Pulse 69  Ht  (1.727 m)  Wt 172 lb (78.019 kg)  BMI 26.16 kg/m2  SpO2 96% , BMI Body mass index is 26.16 kg/(m^2). GEN: Well nourished, well developed, in no acute distress HEENT: normal Neck: no JVD, carotid bruits, or masses Cardiac: RRR; no murmurs, rubs, or gallops,no edema  Respiratory:  clear to auscultation bilaterally, normal work of breathing GI: soft, nontender, nondistended, + BS MS: no deformity or atrophy Skin: warm and dry, no rash Neuro:  Strength and sensation are intact Psych: euthymic mood, full affect   EKG:  EKG is not ordered today.    Recent  Labs: No results found for requested labs within last 365 days.    Lipid Panel No results found for: CHOL, TRIG, HDL, CHOLHDL, VLDL, LDLCALC, LDLDIRECT    Wt Readings from Last 3 Encounters:  04/09/15 172 lb (78.019 kg)  02/16/15 172 lb (78.019 kg)  09/22/14 171 lb (77.565 kg)        ASSESSMENT AND PLAN:  Assessment / Plan: 1. Sporadic premature atrial beats, minimally symptomatic, improved on higher dose Toprol 25 mg daily. 2. Mild hypertension, improved   Current medicines are reviewed at  length with the patient today.  The patient does not have concerns regarding medicines.  The following changes have been made:  no change  Labs/ tests ordered today include:  No orders of the defined types were placed in this encounter.     Disposition: Continue same medication.  Recheck in one year for office visit and EKG.  Karie SchwalbeSigned, Haakon Titsworth MD 04/09/2015 4:19 PM    [facility] [address], [place], [place]  [address] Phone: [phone]; Fax: [phone]

## 2015-05-29 DIAGNOSIS — S62629B Displaced fracture of medial phalanx of unspecified finger, initial encounter for open fracture: Secondary | ICD-10-CM

## 2015-05-29 HISTORY — DX: Displaced fracture of middle phalanx of unspecified finger, initial encounter for open fracture: S62.629B

## 2015-06-10 HISTORY — PX: FINGER SURGERY: SHX640

## 2015-07-06 ENCOUNTER — Other Ambulatory Visit: Payer: 59 | Admitting: Pediatrics

## 2015-07-09 ENCOUNTER — Ambulatory Visit (INDEPENDENT_AMBULATORY_CARE_PROVIDER_SITE_OTHER): Payer: BLUE CROSS/BLUE SHIELD | Admitting: Pediatrics

## 2015-07-09 ENCOUNTER — Encounter: Payer: Self-pay | Admitting: Pediatrics

## 2015-07-09 VITALS — BP 130/78 | HR 88 | Temp 98.7°F | Ht 68.0 in | Wt 169.4 lb

## 2015-07-09 DIAGNOSIS — Z6825 Body mass index (BMI) 25.0-25.9, adult: Secondary | ICD-10-CM | POA: Insufficient documentation

## 2015-07-09 DIAGNOSIS — Z0000 Encounter for general adult medical examination without abnormal findings: Secondary | ICD-10-CM

## 2015-07-09 DIAGNOSIS — J3089 Other allergic rhinitis: Secondary | ICD-10-CM

## 2015-07-09 DIAGNOSIS — Z862 Personal history of diseases of the blood and blood-forming organs and certain disorders involving the immune mechanism: Secondary | ICD-10-CM

## 2015-07-09 DIAGNOSIS — I10 Essential (primary) hypertension: Secondary | ICD-10-CM

## 2015-07-09 MED ORDER — MOMETASONE FUROATE 50 MCG/ACT NA SUSP: 2.0000 | Freq: Every day | NASAL | Status: DC

## 2015-07-09 NOTE — Progress Notes (Signed)
Subjective:    Patient ID: Tami Johnson, female    DOB: October 25, 1975, 39 y.o.   MRN: [id number]  CC: annual exam  HPI: Tami Johnson is a 39 y.o. female presenting on 07/09/2015 for Annual Exam  Wants to make sure sugars going.  Pap smear due next year. BP was 140/90 at gynecologist BP at home was 143/97. Interested in getting off of OCP, thinking about uterine ablation. Has been on BP pills in the past.  Paternal GM had DM2, also had breast ca, dx at 38yo Has had borderline blood sugars in the past Has had a mammogram before, had a cyst, next at age 27yo No headaches or vision Having two weeks of intermittent sweats where her shirt gets wet at night Has pins in fingers   Relevant past medical, surgical, family and social history reviewed and updated as indicated. Interim medical history since our last visit reviewed. Allergies and medications reviewed and updated.   ROS: All systems negative other than what is in HPI  Past Medical History Patient Active Problem List   Diagnosis Date Noted  . BMI 25.0-25.9,adult 07/09/2015  . PAC (premature atrial contraction) 08/25/2014  . Essential hypertension 08/25/2014  . Intermittent palpitations 08/25/2014  . External hemorrhoid, thrombosed 05/04/2012    Current Outpatient Prescriptions  Medication Sig Dispense Refill  . doxycycline (VIBRA-TABS) 100 MG tablet   0  . fluconazole (DIFLUCAN) 100 MG tablet Take 100 mg by mouth daily.    Marland Kitchen ibuprofen (ADVIL,MOTRIN) 200 MG tablet Take 200 mg by mouth as needed.     . metoprolol succinate (TOPROL XL) 25 MG 24 hr tablet Take 1 tablet (25 mg total) by mouth daily. 90 tablet 3  . SPRINTEC 28 0.25-35 MG-MCG tablet     . traMADol (ULTRAM) 50 MG tablet   0  . mometasone (NASONEX) 50 MCG/ACT nasal spray Place 2 sprays into the nose daily. 17 g 12   No current facility-administered medications for this visit.       Objective:    BP 130/78 mmHg  Pulse 88  Temp(Src) 98.7 F  (37.1 C) (Oral)  Ht '5\' 8"'  (1.727 m)  Wt 169 lb 6.4 oz (76.839 kg)  BMI 25.76 kg/m2  Wt Readings from Last 3 Encounters:  07/09/15 169 lb 6.4 oz (76.839 kg)  04/09/15 172 lb (78.019 kg)  02/16/15 172 lb (78.019 kg)    Gen: NAD, alert, cooperative with exam, NCAT EYES: EOMI, no scleral injection or icterus ENT:  TMs pearly gray b/l, OP without erythema, normal thyroid, pale nasal turbinates LYMPH: no cervical LAD CV: NRRR, normal S1/S2, no murmur, distal pulses 2+ b/l Resp: CTABL, no wheezes, normal WOB Abd: +BS, soft, NTND. no guarding or organomegaly Ext: No edema, warm Neuro: Alert and oriented, strength equal b/l UE and LE, coordination grossly normal MSK: normal muscle bulk     Assessment & Plan:   Khandi was seen today for annual exam. Overall doing well, will start nasal steroid for allergic rhinitis. Checking labs today. UTD on HCM. Check BPs at home over next weeks, let me know if regularly elevated over 140/90. Has been on BP meds in the past, lost weight and was able to come back off of them. She is trying to get alternative to [facility] which could also be raising her BP. Encouraged healthy lifestyle changes, regular activity and healthy food choices.  Diagnoses and all orders for this visit:  Encounter for preventive health examination -  BMP8+EGFR -     Thyroid Panel With TSH -     Lipid panel -     CBC  Other allergic rhinitis -     mometasone (NASONEX) 50 MCG/ACT nasal spray; Place 2 sprays into the nose daily.  Essential hypertension  -     BMP8+EGFR  BMI 25.0-25.9,adult  History of anemia -     CBC   Follow up plan: 2 weeks for BP check  Assunta Found, MD [facility] 07/09/2015, 2:17 PM

## 2015-07-09 NOTE — Patient Instructions (Signed)
flonase versus nasonex, I sent in prescription for nasonex and flonase is over the counter

## 2015-07-10 LAB — CBC
Hematocrit: 39.8 % (ref 34.0–46.6)
Hemoglobin: 13.3 g/dL (ref 11.1–15.9)
MCH: 30.1 pg (ref 26.6–33.0)
MCHC: 33.4 g/dL (ref 31.5–35.7)
MCV: 90 fL (ref 79–97)
Platelets: 259 10*3/uL (ref 150–379)
RBC: 4.42 x10E6/uL (ref 3.77–5.28)
RDW: 13.7 % (ref 12.3–15.4)
WBC: 6.1 10*3/uL (ref 3.4–10.8)

## 2015-07-10 LAB — BMP8+EGFR
BUN / CREAT RATIO: 13 (ref 8–20)
BUN: 8 mg/dL (ref 6–20)
CHLORIDE: 103 mmol/L (ref 97–108)
CO2: 25 mmol/L (ref 18–29)
Calcium: 9.3 mg/dL (ref 8.7–10.2)
Creatinine, Ser: 0.61 mg/dL (ref 0.57–1.00)
GFR calc Af Amer: 133 mL/min/{1.73_m2} (ref >59)
GFR calc non Af Amer: 115 mL/min/{1.73_m2} (ref >59)
Glucose: 89 mg/dL (ref 65–99)
Potassium: 4.2 mmol/L (ref 3.5–5.2)
Sodium: 141 mmol/L (ref 134–144)

## 2015-07-10 LAB — LIPID PANEL
Chol/HDL Ratio: 4.2 ratio units (ref 0.0–4.4)
Cholesterol, Total: 233 mg/dL — ABNORMAL HIGH (ref 100–199)
HDL: 56 mg/dL (ref >39)
LDL CALC: 151 mg/dL — AB (ref 0–99)
Triglycerides: 129 mg/dL (ref 0–149)
VLDL Cholesterol Cal: 26 mg/dL (ref 5–40)

## 2015-07-10 LAB — THYROID PANEL WITH TSH
FREE THYROXINE INDEX: 2.2 (ref 1.2–4.9)
T3 Uptake Ratio: 23 % — ABNORMAL LOW (ref 24–39)
T4, Total: 9.5 ug/dL (ref 4.5–12.0)
TSH: 1.6 u[IU]/mL (ref 0.450–4.500)

## 2015-08-24 DIAGNOSIS — S62621D Displaced fracture of medial phalanx of left index finger, subsequent encounter for fracture with routine healing: Secondary | ICD-10-CM

## 2015-08-24 HISTORY — DX: Displaced fracture of middle phalanx of left index finger, subsequent encounter for fracture with routine healing: S62.621D

## 2015-11-23 ENCOUNTER — Ambulatory Visit (INDEPENDENT_AMBULATORY_CARE_PROVIDER_SITE_OTHER): Payer: BLUE CROSS/BLUE SHIELD | Admitting: Family Medicine

## 2015-11-23 ENCOUNTER — Encounter: Payer: Self-pay | Admitting: Family Medicine

## 2015-11-23 VITALS — BP 124/72 | HR 64 | Temp 98.7°F | Ht 68.0 in | Wt 169.8 lb

## 2015-11-23 DIAGNOSIS — J3089 Other allergic rhinitis: Secondary | ICD-10-CM

## 2015-11-23 DIAGNOSIS — J0100 Acute maxillary sinusitis, unspecified: Secondary | ICD-10-CM

## 2015-11-23 MED ORDER — CETIRIZINE HCL 10 MG PO TABS: 10.0000 mg | ORAL_TABLET | Freq: Every day | ORAL | Status: DC

## 2015-11-23 MED ORDER — PSEUDOEPHEDRINE-GUAIFENESIN ER 120-1200 MG PO TB12: 1.0000 | ORAL_TABLET | Freq: Two times a day (BID) | ORAL | Status: DC

## 2015-11-23 MED ORDER — MOMETASONE FUROATE 50 MCG/ACT NA SUSP: 2.0000 | Freq: Every day | NASAL | Status: DC

## 2015-11-23 MED ORDER — AMOXICILLIN-POT CLAVULANATE 875-125 MG PO TABS: 1.0000 | ORAL_TABLET | Freq: Two times a day (BID) | ORAL | Status: DC

## 2015-11-23 NOTE — Progress Notes (Signed)
Subjective:  Patient ID: Tami Johnson, female    DOB: Jan 26, 1976  Age: 40 y.o. MRN: [id number]  CC: Sinusitis   HPI Tami Johnson presents for Symptoms include congestion, facial pain, nasal congestion, no  fever, non productive cough, post nasal drip and sinus pressure with no fever, chills, night sweats or weight loss. Onset of symptoms was a few days ago, gradually worsening since that time. Pt.is drinking moderate amounts of fluids.  Lots of dust exposure. Also keeps a dog in the house in spite of allergy to dog. Uses Claritin & nasal spray.     History Tami Johnson has no past medical history on file.   She has past surgical history that includes Finger surgery (06/10/15).   Her family history includes Hypertension in her father. There is no history of Heart attack or Stroke.She reports that she has never smoked. She does not have any smokeless tobacco history on file. She reports that she does not drink alcohol or use illicit drugs.    ROS Review of Systems  Constitutional: Negative for fever, chills, activity change and appetite change.  HENT: Positive for congestion, postnasal drip, rhinorrhea and sinus pressure. Negative for ear discharge, ear pain, hearing loss, nosebleeds, sneezing and trouble swallowing.   Respiratory: Negative for chest tightness and shortness of breath.   Cardiovascular: Negative for chest pain and palpitations.  Skin: Negative for rash.    Objective:  BP 124/72 mmHg  Pulse 64  Temp(Src) 98.7 F (37.1 C) (Oral)  Ht  (1.727 m)  Wt 169 lb 12.8 oz (77.021 kg)  BMI 25.82 kg/m2  SpO2 99%  LMP 10/23/2015  BP Readings from Last 3 Encounters:  11/23/15 124/72  07/09/15 130/78  04/09/15 130/88    Wt Readings from Last 3 Encounters:  11/23/15 169 lb 12.8 oz (77.021 kg)  07/09/15 169 lb 6.4 oz (76.839 kg)  04/09/15 172 lb (78.019 kg)     Physical Exam  Constitutional: She appears well-developed and well-nourished.  HENT:  Head:  Normocephalic and atraumatic.  Right Ear: Tympanic membrane and external ear normal. No decreased hearing is noted.  Left Ear: Tympanic membrane and external ear normal. No decreased hearing is noted.  Nose: Mucosal edema present. Right sinus exhibits no frontal sinus tenderness. Left sinus exhibits no frontal sinus tenderness.  Mouth/Throat: No oropharyngeal exudate or posterior oropharyngeal erythema.  Neck: No Brudzinski's sign noted.  Pulmonary/Chest: Breath sounds normal. No respiratory distress.  Lymphadenopathy:       Head (right side): No preauricular adenopathy present.       Head (left side): No preauricular adenopathy present.       Right cervical: No superficial cervical adenopathy present.      Left cervical: No superficial cervical adenopathy present.     Lab Results  Component Value Date   WBC 6.1 07/09/2015   HCT 39.8 07/09/2015   PLT 259 07/09/2015   GLUCOSE 89 07/09/2015   CHOL 233* 07/09/2015   TRIG 129 07/09/2015   HDL 56 07/09/2015   LDLCALC 151* 07/09/2015   NA 141 07/09/2015   K 4.2 07/09/2015   CL 103 07/09/2015   CREATININE 0.61 07/09/2015   BUN 8 07/09/2015   CO2 25 07/09/2015   TSH 1.600 07/09/2015    Dg Chest 2 View  08/29/2014  CLINICAL DATA:  Premature atrial contraction EXAM: CHEST  2 VIEW COMPARISON:  None. FINDINGS: The heart size and mediastinal contours are within normal limits. Both lungs are clear. The visualized skeletal  structures are unremarkable. IMPRESSION: No active cardiopulmonary disease. Electronically Signed   By: Natasha Mead M.D.   On: 08/29/2014 09:42    Assessment & Plan:   Gianna was seen today for sinusitis.  Diagnoses and all orders for this visit:  Other allergic rhinitis -     mometasone (NASONEX) 50 MCG/ACT nasal spray; Place 2 sprays into the nose daily.  Acute maxillary sinusitis, recurrence not specified  Other orders -     Pseudoephedrine-Guaifenesin [phone] MG TB12; Take 1 tablet by mouth 2 (two) times  daily. For congestion -     amoxicillin-clavulanate (AUGMENTIN) 875-125 MG tablet; Take 1 tablet by mouth 2 (two) times daily. Take all of this medication -     cetirizine (ZYRTEC) 10 MG tablet; Take 1 tablet (10 mg total) by mouth daily.      I have discontinued Ms. Kirtz's SPRINTEC 28, fluconazole, doxycycline, and traMADol. I am also having her start on Pseudoephedrine-Guaifenesin, amoxicillin-clavulanate, and cetirizine. Additionally, I am having her maintain her ibuprofen, metoprolol succinate, and mometasone.  Meds ordered this encounter  Medications  . Pseudoephedrine-Guaifenesin [phone] MG TB12    Sig: Take 1 tablet by mouth 2 (two) times daily. For congestion    Dispense:  20 each    Refill:  0  . amoxicillin-clavulanate (AUGMENTIN) 875-125 MG tablet    Sig: Take 1 tablet by mouth 2 (two) times daily. Take all of this medication    Dispense:  20 tablet    Refill:  0  . cetirizine (ZYRTEC) 10 MG tablet    Sig: Take 1 tablet (10 mg total) by mouth daily.    Dispense:  30 tablet    Refill:  11  . mometasone (NASONEX) 50 MCG/ACT nasal spray    Sig: Place 2 sprays into the nose daily.    Dispense:  17 g    Refill:  12     Follow-up: Return if symptoms worsen or fail to improve.  Mechele Claude, M.D.

## 2015-11-24 ENCOUNTER — Telehealth: Payer: Self-pay | Admitting: Family Medicine

## 2015-11-24 NOTE — Telephone Encounter (Signed)
Letter printed and pt is aware to come pick up letter.

## 2015-12-14 ENCOUNTER — Other Ambulatory Visit: Payer: Self-pay | Admitting: Nurse Practitioner

## 2015-12-14 MED ORDER — VALACYCLOVIR HCL 1 G PO TABS: 1000.0000 mg | ORAL_TABLET | Freq: Two times a day (BID) | ORAL | Status: DC

## 2016-04-13 ENCOUNTER — Ambulatory Visit (INDEPENDENT_AMBULATORY_CARE_PROVIDER_SITE_OTHER): Payer: BLUE CROSS/BLUE SHIELD | Admitting: Family Medicine

## 2016-04-13 ENCOUNTER — Encounter: Payer: Self-pay | Admitting: Family Medicine

## 2016-04-13 VITALS — BP 120/81 | HR 64 | Temp 97.7°F | Ht 68.0 in | Wt 179.6 lb

## 2016-04-13 DIAGNOSIS — J0110 Acute frontal sinusitis, unspecified: Secondary | ICD-10-CM

## 2016-04-13 MED ORDER — METHYLPREDNISOLONE ACETATE 80 MG/ML IJ SUSP
80.0000 mg | Freq: Once | INTRAMUSCULAR | Status: AC
  Administered 2016-04-13: 80 mg via INTRAMUSCULAR

## 2016-04-13 MED ORDER — AMOXICILLIN-POT CLAVULANATE 875-125 MG PO TABS: 1.0000 | ORAL_TABLET | Freq: Two times a day (BID) | ORAL | Status: DC

## 2016-04-13 NOTE — Progress Notes (Signed)
   HPI  Patient presents today here with headache.  Patient explains that she's had headache, congestion, and left ear pain and muffled hearing over the last 2-3 days. She states symptoms started Sunday night and worsened throughout the day Monday. She has persistent frontal headache pain which radiates to her bilateral shoulders. She states this is not the worse headache she's ever had, it started insidiously and got worse from there.  It is not similar to previous sinus infections that she has a long history of sinus infections recurrently.  She is tolerating food and fluids normally, she denies fever, chills, sweats.  PMH: Smoking status noted ROS: Per HPI  Objective: BP 120/81 mmHg  Pulse 64  Temp(Src) 97.7 F (36.5 C) (Oral)  Ht 5\' 8"  (1.727 m)  Wt 179 lb 9.6 oz (81.466 kg)  BMI 27.31 kg/m2 Gen: NAD, alert, cooperative with exam HEENT: NCAT, EOMI, PERRL, tenderness to palpation of bilateral frontal sinuses, no tenderness to palpation of maxillary sinuses, scarring in left TM with preserved landmarks, right TM is normal also, left-sided turbinates swollen CV: RRR, good S1/S2, no murmur Resp: CTABL, no wheezes, non-labored Ext: No edema, warm Neuro: Alert and oriented, drink 5/5 and sensation intact in bilateral upper and lower extremities, 2+ patellar tendon reflexes bilaterally, EOMI, PERRLA, cranial nerves II through XII intact   Assessment and plan:  # Headache acute frontal sinusitis Treat with IM Depo-Medrol and Augmentin Given persistent headache for 2-3 days if she does not have quick relief with steroid injection and antibiotics have asked her to come back in 2 days. Low threshold for CT scan given persistent severe headache. After my exam she has tenderness of the sinuses and swollen turbinates making me feel that the most likely thing is sinusitis.      Meds ordered this encounter  Medications  . amoxicillin-clavulanate (AUGMENTIN) 875-125 MG tablet    Sig:  Take 1 tablet by mouth 2 (two) times daily.    Dispense:  20 tablet    Refill:  0  . methylPREDNISolone acetate (DEPO-MEDROL) injection 80 mg    Sig:     Murtis SinkSam Rolfe Hartsell, MD [facility] 04/13/2016, 9:57 AM

## 2016-04-13 NOTE — Patient Instructions (Signed)
Great to meet you!  I am treating you for a sinus infection, if you are not getting better within 2 days or if you are getting worse please come back.   Sinusitis, Adult Sinusitis is redness, soreness, and puffiness (inflammation) of the air pockets in the bones of your face (sinuses). The redness, soreness, and puffiness can cause air and mucus to get trapped in your sinuses. This can allow germs to grow and cause an infection.  HOME CARE   Drink enough fluids to keep your pee (urine) clear or pale yellow.  Use a humidifier in your home.  Run a hot shower to create steam in the bathroom. Sit in the bathroom with the door closed. Breathe in the steam 3-4 times a day.  Put a warm, moist washcloth on your face 3-4 times a day, or as told by your doctor.  Use salt water sprays (saline sprays) to wet the thick fluid in your nose. This can help the sinuses drain.  Only take medicine as told by your doctor. GET HELP RIGHT AWAY IF:   Your pain gets worse.  You have very bad headaches.  You are sick to your stomach (nauseous).  You throw up (vomit).  You are very sleepy (drowsy) all the time.  Your face is puffy (swollen).  Your vision changes.  You have a stiff neck.  You have trouble breathing. MAKE SURE YOU:   Understand these instructions.  Will watch your condition.  Will get help right away if you are not doing well or get worse.   This information is not intended to replace advice given to you by your health care provider. Make sure you discuss any questions you have with your health care provider.   Document Released: 02/29/2008 Document Revised: 10/03/2014 Document Reviewed: 04/17/2012 Elsevier Interactive Patient Education [employer]2016 Elsevier Inc.

## 2016-04-20 ENCOUNTER — Other Ambulatory Visit: Payer: Self-pay

## 2016-04-20 MED ORDER — METOPROLOL SUCCINATE ER 25 MG PO TB24: 25.0000 mg | ORAL_TABLET | Freq: Every day | ORAL | 3 refills | Status: DC

## 2016-04-20 NOTE — Telephone Encounter (Signed)
New message   *STAT* If patient is at the pharmacy, call can be transferred to refill team.   1. Which medications need to be refilled? (please list name of each medication and dose if known)Lotoprol 25mg   2. Which pharmacy/location (including street and city if local pharmacy) is medication to be sent to? [facility]- [phone]  3. Do they need a 30 day or 90 day supply? 30

## 2016-04-21 ENCOUNTER — Telehealth: Payer: Self-pay | Admitting: Cardiology

## 2016-04-21 NOTE — Telephone Encounter (Signed)
Returned pt call-pt reports Rx was suppose to be sent to pharmacy (Metoprolol succinate 25mg  24 hr tablet)-[facility] in [place].   [facility] seems to [facility] [place].  Pt verbalized that there was no need to send another prescription to [facility] that she would go to [facility] and get it.  Advised pt to call back if needed. Pt verbalized understanding.       Due for appt (former pt of [facility])-appt already scheduled for 8/9 with MD Hochrein.

## 2016-04-21 NOTE — Telephone Encounter (Signed)
New Message  Pt call requesting to speak with RN about a medication refill that was to be fill [facility]. [facility] not [facility] medication please call back to discuss

## 2016-05-03 NOTE — Progress Notes (Signed)
Cardiology Office Note   Date:  05/04/2016   ID:  Tami Johnson, DOB [birth year], MRN [id number]  PCP:  Johna Sheriff, MD  Cardiologist:   Rollene Rotunda, MD   Chief Complaint  Patient presents with  . PACs      History of Present Illness: Tami Johnson is a 40 y.o. female who presents for follow up of palpitations.  She has had documented PACs.  She has had a cardiac work up with an echo demonstrating a normal EF no significant valvular abnormalities.    She was previously seeing Dr. Patty Sermons.    She was noted to have palpitations during a routine exam. She has never really felt. She has been treated with beta blocker for both these for some high blood pressure. She says she does well. She stays active at home and has a job. The patient denies any new symptoms such as chest discomfort, neck or arm discomfort. There has been no new shortness of breath, PND or orthopnea. There have been no reported palpitations, presyncope or syncope.  No past medical history on file.  Past Surgical History:  Procedure Laterality Date  . FINGER SURGERY  06/10/15     Current Outpatient Prescriptions  Medication Sig Dispense Refill  . cetirizine (ZYRTEC) 10 MG tablet Take 1 tablet (10 mg total) by mouth daily. 30 tablet 11  . ibuprofen (ADVIL,MOTRIN) 200 MG tablet Take 200 mg by mouth as needed.     . Magnesium 200 MG TABS Take 1 tablet by mouth daily.    . metoprolol succinate (TOPROL XL) 25 MG 24 hr tablet Take 1 tablet (25 mg total) by mouth daily. 90 tablet 3   No current facility-administered medications for this visit.     Allergies:   Ciprofloxacin; Sulfa antibiotics; and Sulfamethoxazole-trimethoprim    ROS:  Please see the history of present illness.   Otherwise, review of systems are positive for none.   All other systems are reviewed and negative.    PHYSICAL EXAM: VS:  BP 138/86   Pulse (!) 54   Ht  (1.727 m)   Wt 179 lb 6.4 oz (81.4 kg)   BMI 27.28 kg/m   , BMI Body mass index is 27.28 kg/m. GENERAL:  Well appearing HEENT:  Pupils equal round and reactive, fundi not visualized, oral mucosa unremarkable NECK:  No jugular venous distention, waveform within normal limits, carotid upstroke brisk and symmetric, no bruits, no thyromegaly LYMPHATICS:  No cervical, inguinal adenopathy LUNGS:  Clear to auscultation bilaterally BACK:  No CVA tenderness CHEST:  Unremarkable HEART:  PMI not displaced or sustained,S1 and S2 within normal limits, no S3, no S4, no clicks, no rubs, no murmurs ABD:  Flat, positive bowel sounds normal in frequency in pitch, no bruits, no rebound, no guarding, no midline pulsatile mass, no hepatomegaly, no splenomegaly EXT:  2 plus pulses throughout, no edema, no cyanosis no clubbing SKIN:  No rashes no nodules NEURO:  Cranial nerves II through XII grossly intact, motor grossly intact throughout PSYCH:  Cognitively intact, oriented to person place and time    EKG:  EKG is ordered today. The ekg ordered today demonstrates sinus bradycardia, rate 54, axis within normal limits, intervals within normal limits, no acute ST-T wave changes.   Recent Labs: 07/09/2015: BUN 8; Creatinine, Ser 0.61; Platelets 259; Potassium 4.2; Sodium 141; TSH 1.600    Lipid Panel    Component Value Date/Time   CHOL 233 (H) 07/09/2015 1459  TRIG 129 07/09/2015 1459   HDL 56 07/09/2015 1459   CHOLHDL 4.2 07/09/2015 1459   LDLCALC 151 (H) 07/09/2015 1459      Wt Readings from Last 3 Encounters:  05/04/16 179 lb 6.4 oz (81.4 kg)  04/13/16 179 lb 9.6 oz (81.5 kg)  11/23/15 169 lb 12.8 oz (77 kg)      Other studies Reviewed: Additional studies/ records that were reviewed today include: none. Review of the above records demonstrates:  Please see elsewhere in the note.     ASSESSMENT AND PLAN:  PACs :   She's not having any symptoms from this. No further workup is planned. She can continue on the low dose beta blocker.  I suggested  that she come back and see me as needed and would ask her primary care team can renew her prescription future.  HTN:    Her blood pressure is well controlled. She will continue the meds as listed. I encouraged exercise.   Current medicines are reviewed at length with the patient today.  The patient does not have concerns regarding medicines.  The following changes have been made:  no change  Labs/ tests ordered today include:   Orders Placed This Encounter  Procedures  . EKG 12-Lead     Disposition:   FU with me as needed.      Signed, Rollene RotundaJames Ruhani Umland, MD  05/04/2016 5:10 PM    [facility]

## 2016-05-04 ENCOUNTER — Ambulatory Visit (INDEPENDENT_AMBULATORY_CARE_PROVIDER_SITE_OTHER): Payer: BLUE CROSS/BLUE SHIELD | Admitting: Cardiology

## 2016-05-04 ENCOUNTER — Encounter (INDEPENDENT_AMBULATORY_CARE_PROVIDER_SITE_OTHER): Payer: Self-pay

## 2016-05-04 ENCOUNTER — Encounter: Payer: Self-pay | Admitting: Cardiology

## 2016-05-04 VITALS — BP 138/86 | HR 54 | Ht 68.0 in | Wt 179.4 lb

## 2016-05-04 DIAGNOSIS — I10 Essential (primary) hypertension: Secondary | ICD-10-CM | POA: Diagnosis not present

## 2016-05-04 DIAGNOSIS — I491 Atrial premature depolarization: Secondary | ICD-10-CM

## 2016-05-04 MED ORDER — METOPROLOL SUCCINATE ER 25 MG PO TB24: 25.0000 mg | ORAL_TABLET | Freq: Every day | ORAL | 3 refills | Status: DC

## 2016-05-04 NOTE — Patient Instructions (Signed)
Your physician recommends that you schedule a follow-up appointment in: As Needed    

## 2016-08-16 ENCOUNTER — Ambulatory Visit (INDEPENDENT_AMBULATORY_CARE_PROVIDER_SITE_OTHER): Payer: BLUE CROSS/BLUE SHIELD | Admitting: Pediatrics

## 2016-08-16 ENCOUNTER — Encounter: Payer: Self-pay | Admitting: Pediatrics

## 2016-08-16 VITALS — BP 141/88 | HR 75 | Temp 99.1°F | Ht 68.0 in | Wt 184.0 lb

## 2016-08-16 DIAGNOSIS — H8112 Benign paroxysmal vertigo, left ear: Secondary | ICD-10-CM | POA: Diagnosis not present

## 2016-08-16 MED ORDER — MECLIZINE HCL 12.5 MG PO TABS: 12.5000 mg | ORAL_TABLET | Freq: Three times a day (TID) | ORAL | 0 refills | Status: DC | PRN

## 2016-08-16 MED ORDER — FLUTICASONE PROPIONATE 50 MCG/ACT NA SUSP: 1.0000 | Freq: Every day | NASAL | 2 refills | Status: DC

## 2016-08-16 NOTE — Patient Instructions (Addendum)
Epley Maneuver Self-Care  WHAT IS THE EPLEY MANEUVER?  The Epley maneuver is an exercise you can do to relieve symptoms of benign paroxysmal positional vertigo (BPPV). This condition is often just referred to as vertigo. BPPV is caused by the movement of tiny crystals (canaliths) inside your inner ear. The accumulation and movement of canaliths in your inner ear causes a sudden spinning sensation (vertigo) when you move your head to certain positions. Vertigo usually lasts about 30 seconds. BPPV usually occurs in just one ear. If you get vertigo when you lie on your left side, you probably have BPPV in your left ear. Your health care provider can tell you which ear is involved.   BPPV may be caused by a head injury. Many people older than 50 get BPPV for unknown reasons. If you have been diagnosed with BPPV, your health care provider may teach you how to do this maneuver. BPPV is not life threatening (benign) and usually goes away in time.   WHEN SHOULD I PERFORM THE EPLEY MANEUVER?  You can do this maneuver at home whenever you have symptoms of vertigo. You may do the Epley maneuver up to 3 times a day until your symptoms of vertigo go away.  HOW SHOULD I DO THE EPLEY MANEUVER?  1. Sit on the edge of a bed or table with your back straight. Your legs should be extended or hanging over the edge of the bed or table.    2. Turn your head halfway toward the affected ear.    3. Lie backward quickly with your head turned until you are lying flat on your back. You may want to position a pillow under your shoulders.    4. Hold this position for 30 seconds. You may experience an attack of vertigo. This is normal. Hold this position until the vertigo stops.  5. Then turn your head to the opposite direction until your unaffected ear is facing the floor.    6. Hold this position for 30 seconds. You may experience an attack of vertigo. This is normal. Hold this position until the vertigo stops.   7. Now turn your whole body to the same side as your head. Hold for another 30 seconds.    8. You can then sit back up.  ARE THERE RISKS TO THIS MANEUVER?  In some cases, you may have other symptoms (such as changes in your vision, weakness, or numbness). If you have these symptoms, stop doing the maneuver and call your health care provider. Even if doing these maneuvers relieves your vertigo, you may still have dizziness. Dizziness is the sensation of light-headedness but without the sensation of movement. Even though the Epley maneuver may relieve your vertigo, it is possible that your symptoms will return within 5 years.  WHAT SHOULD I DO AFTER THIS MANEUVER?  After doing the Epley maneuver, you can return to your normal activities. Ask your doctor if there is anything you should do at home to prevent vertigo. This may include:  · Sleeping with two or more pillows to keep your head elevated.  · Not sleeping on the side of your affected ear.  · Getting up slowly from bed.  · Avoiding sudden movements during the day.  · Avoiding extreme head movement, like looking up or bending over.  · Wearing a cervical collar to prevent sudden head movements.  WHAT SHOULD I DO IF MY SYMPTOMS GET WORSE?  Call your health care provider if your vertigo gets worse. Call your provider right way if   you have other symptoms, including:   · Nausea.  · Vomiting.  · Headache.  · Weakness.  · Numbness.  · Vision changes.     This information is not intended to replace advice given to you by your health care provider. Make sure you discuss any questions you have with your health care provider.     Document Released: 09/17/2013 Document Reviewed: 09/17/2013  Elsevier Interactive Patient Education ©2017 Elsevier Inc.

## 2016-08-16 NOTE — Progress Notes (Signed)
  Subjective:   Patient ID: Tami Johnson, female    DOB: [birth year], 40 y.o.   MRN: [id number] CC: Dizziness  HPI: Tami Johnson is a 40 y.o. female presenting for Dizziness  Sinus symptoms Taking claritin D past two days Ears feel stopped up Has had dizziness for about a week Feels liek she is on a boat, shifting back and forth Usually has symptoms when she puts L side down No room spinning Feels like she has decreased hearing L ear for years, used to have hearing aids   Relevant past medical, surgical, family and social history reviewed. Allergies and medications reviewed and updated. History  Smoking Status  . Never Smoker  Smokeless Tobacco  . Never Used   ROS: Per HPI   Objective:    BP (!) 141/88 (BP Location: Left Arm)   Pulse 75   Temp 99.1 F (37.3 C) (Oral)   Ht 5\' 8"  (1.727 m)   Wt 184 lb (83.5 kg)   LMP 08/27/2015 (Approximate) Comment: ablation  BMI 27.98 kg/m   Wt Readings from Last 3 Encounters:  08/16/16 184 lb (83.5 kg)  05/04/16 179 lb 6.4 oz (81.4 kg)  04/13/16 179 lb 9.6 oz (81.5 kg)    Gen: NAD, alert, cooperative with exam, NCAT EYES: EOMI, no conjunctival injection, or no icterus, 1-2 beats of nystagmus with both R-ward and L ward gaze ENT:  L TM scarred, R TM with clear effusion, OP without erythema LYMPH: no cervical LAD CV: NRRR, normal S1/S2, no murmur, distal pulses 2+ b/l Resp: CTABL, no wheezes, normal WOB Abd: +BS, soft, NTND. no guarding or organomegaly Ext: No edema, warm Neuro: Alert and oriented  Assessment & Plan:  Tami Johnson was seen today for dizziness.  Diagnoses and all orders for this visit:  Benign paroxysmal positional vertigo of left ear Discussed epley manuevers, gave handout If not imrpoving over next week RTC flonase for middle ear effusion -     meclizine (ANTIVERT) 12.5 MG tablet; Take 1 tablet (12.5 mg total) by mouth 3 (three) times daily as needed for dizziness.  Other orders -     fluticasone  (FLONASE) 50 MCG/ACT nasal spray; Place 1 spray into both nostrils daily.   Follow up plan: Return in about 4 weeks (around 09/13/2016) for complete physical exam when you are able. Rex Krasarol Young Mulvey, MD [facility]

## 2016-08-26 ENCOUNTER — Ambulatory Visit (INDEPENDENT_AMBULATORY_CARE_PROVIDER_SITE_OTHER): Payer: BLUE CROSS/BLUE SHIELD | Admitting: Pediatrics

## 2016-08-26 ENCOUNTER — Encounter: Payer: Self-pay | Admitting: Pediatrics

## 2016-08-26 VITALS — BP 130/89 | HR 61 | Temp 98.2°F | Ht 68.0 in | Wt 184.6 lb

## 2016-08-26 DIAGNOSIS — Z6828 Body mass index (BMI) 28.0-28.9, adult: Secondary | ICD-10-CM

## 2016-08-26 DIAGNOSIS — I491 Atrial premature depolarization: Secondary | ICD-10-CM

## 2016-08-26 DIAGNOSIS — Z0000 Encounter for general adult medical examination without abnormal findings: Secondary | ICD-10-CM

## 2016-08-26 DIAGNOSIS — I10 Essential (primary) hypertension: Secondary | ICD-10-CM

## 2016-08-26 DIAGNOSIS — H8112 Benign paroxysmal vertigo, left ear: Secondary | ICD-10-CM

## 2016-08-26 NOTE — Progress Notes (Signed)
  Subjective:   Patient ID: Tami Johnson, female    DOB: [birth year], 40 y.o.   MRN: [id number] CC: Annual Exam  HPI: Tami Johnson is a 40 y.o. female presenting for Annual Exam  Continues to have episodes lasting a couple seconds of room spinning or "a little movement" Sometimes happens when she rolls over in bed Yesterday twice Usually when she rolls over on L side in bed gets the feeling, usually sleeps on L side Rolling out of bed also causes it Somewhat better than last week, seen for same thing Some days better than others  Not on anything for birth control now, s/p ablation  Elevated BMI: Likes frappes from [employer] At work doesn't snack Does eat fast food, apprx 3 times week  Relevant past medical, surgical, family and social history reviewed. Allergies and medications reviewed and updated. No fam hx of colon ca History  Smoking Status  . Never Smoker  Smokeless Tobacco  . Never Used   ROS: All systems negativ eother than what is in HPI  Objective:    BP 130/89   Pulse 61   Temp 98.2 F (36.8 C) (Oral)   Ht '5\' 8"'$  (1.727 m)   Wt 184 lb 9.6 oz (83.7 kg)   LMP 08/27/2015 (Approximate) Comment: ablation  BMI 28.07 kg/m   Wt Readings from Last 3 Encounters:  08/26/16 184 lb 9.6 oz (83.7 kg)  08/16/16 184 lb (83.5 kg)  05/04/16 179 lb 6.4 oz (81.4 kg)    Gen: NAD, alert, cooperative with exam, NCAT EYES: EOMI, no conjunctival injection, or no icterus ENT:  Clear effusion R TM, scarred opaque L TM. OP without erythema LYMPH: no cervical LAD CV: NRRR, normal S1/S2, no murmur, distal pulses 2+ b/l Resp: CTABL, no wheezes, normal WOB Abd: +BS, soft, NTND. no guarding or organomegaly Ext: No edema, warm Neuro: Alert and oriented MSK: normal muscle bulk  Assessment & Plan:  Tami Johnson was seen today for annual exam.  Diagnoses and all orders for this visit:  Encounter for preventive health examination Pap smear 06/2016 -     CMP14+EGFR -      Lipid panel -     TSH  Essential hypertension Elevated today initially, improved with recheck Cont to follow at home, let me know if elevated  PAC (premature atrial contraction) Cont metoprolol  BMI 28.0-28.9,adult Discussed lifestyle changes, diet changes, decreasing sugar, decreasing fast food intake  Benign paroxysmal positional vertigo of left ear Continue flonase, start epley maneuvers, if not improving will refer to ENT  Follow up plan: Return in about 3 months (around 11/24/2016) for follow up. Assunta Found, MD [place]

## 2016-08-27 LAB — CMP14+EGFR
A/G RATIO: 2 (ref 1.2–2.2)
ALT: 18 IU/L (ref 0–32)
AST: 13 IU/L (ref 0–40)
Albumin: 4.3 g/dL (ref 3.5–5.5)
Alkaline Phosphatase: 53 IU/L (ref 39–117)
BILIRUBIN TOTAL: 0.3 mg/dL (ref 0.0–1.2)
BUN/Creatinine Ratio: 20 (ref 9–23)
BUN: 12 mg/dL (ref 6–20)
CO2: 22 mmol/L (ref 18–29)
Calcium: 8.6 mg/dL — ABNORMAL LOW (ref 8.7–10.2)
Chloride: 105 mmol/L (ref 96–106)
Creatinine, Ser: 0.61 mg/dL (ref 0.57–1.00)
GFR calc non Af Amer: 115 mL/min/{1.73_m2} (ref >59)
GFR, EST AFRICAN AMERICAN: 132 mL/min/{1.73_m2} (ref >59)
Globulin, Total: 2.2 g/dL (ref 1.5–4.5)
Glucose: 96 mg/dL (ref 65–99)
POTASSIUM: 4.4 mmol/L (ref 3.5–5.2)
Sodium: 140 mmol/L (ref 134–144)
TOTAL PROTEIN: 6.5 g/dL (ref 6.0–8.5)

## 2016-08-27 LAB — TSH: TSH: 1.38 u[IU]/mL (ref 0.450–4.500)

## 2016-08-27 LAB — LIPID PANEL
Chol/HDL Ratio: 4.3 ratio (ref 0.0–4.4)
Cholesterol, Total: 197 mg/dL (ref 100–199)
HDL: 46 mg/dL (ref >39)
LDL Calculated: 143 mg/dL — ABNORMAL HIGH (ref 0–99)
Triglycerides: 39 mg/dL (ref 0–149)
VLDL Cholesterol Cal: 8 mg/dL (ref 5–40)

## 2016-10-20 ENCOUNTER — Telehealth: Payer: Self-pay | Admitting: Pediatrics

## 2016-10-20 ENCOUNTER — Other Ambulatory Visit: Payer: Self-pay | Admitting: *Deleted

## 2016-10-20 MED ORDER — CETIRIZINE HCL 10 MG PO TABS: 10.0000 mg | ORAL_TABLET | Freq: Every day | ORAL | 3 refills | Status: DC

## 2016-10-20 MED ORDER — METOPROLOL TARTRATE 25 MG PO TABS: 25.0000 mg | ORAL_TABLET | Freq: Two times a day (BID) | ORAL | 3 refills | Status: DC

## 2016-10-20 NOTE — Progress Notes (Signed)
Pt requesting rxs be sent to [facility] due to cost Okayed per Dr Oswaldo DoneVincent

## 2016-10-20 NOTE — Telephone Encounter (Signed)
Insurance will not cover allergy medication Pt will check  Insurance formulary and get back to us

## 2016-12-20 ENCOUNTER — Other Ambulatory Visit: Payer: Self-pay | Admitting: Pediatrics

## 2016-12-20 MED ORDER — VALACYCLOVIR HCL 1 G PO TABS: 1000.0000 mg | ORAL_TABLET | Freq: Two times a day (BID) | ORAL | 0 refills | Status: DC

## 2016-12-20 NOTE — Telephone Encounter (Signed)
Refill has been sent to pharmacy patient aware she will need to be seen for any further refills

## 2016-12-20 NOTE — Telephone Encounter (Signed)
What is the name of the medication? Valtrex generic  Have you contacted your pharmacy to request a refill? No. This will be new at this pharmacy  Which pharmacy would you like [facility] sent to[facility] [place].   Patient notified that their request is being sent to the clinical staff for review and that they should receive a call once it is complete. If they do not receive a call within 24 hours they can check with their pharmacy or our office.

## 2017-08-23 ENCOUNTER — Other Ambulatory Visit: Payer: Self-pay | Admitting: Obstetrics and Gynecology

## 2017-08-23 DIAGNOSIS — N6320 Unspecified lump in the left breast, unspecified quadrant: Secondary | ICD-10-CM

## 2017-08-28 ENCOUNTER — Other Ambulatory Visit: Payer: Self-pay | Admitting: Obstetrics and Gynecology

## 2017-08-28 ENCOUNTER — Ambulatory Visit
Admission: RE | Admit: 2017-08-28 | Discharge: 2017-08-28 | Disposition: A | Discharge status: Home / Self Care | Payer: BLUE CROSS/BLUE SHIELD | Source: Ambulatory Visit | Attending: Obstetrics and Gynecology | Admitting: Obstetrics and Gynecology

## 2017-08-28 ENCOUNTER — Ambulatory Visit
Admission: RE | Admit: 2017-08-28 | Discharge: 2017-08-28 | Disposition: A | Discharge status: Home / Self Care | Payer: 59 | Source: Ambulatory Visit | Attending: Obstetrics and Gynecology | Admitting: Obstetrics and Gynecology

## 2017-08-28 DIAGNOSIS — N6320 Unspecified lump in the left breast, unspecified quadrant: Secondary | ICD-10-CM

## 2017-08-28 DIAGNOSIS — R928 Other abnormal and inconclusive findings on diagnostic imaging of breast: Secondary | ICD-10-CM

## 2017-09-14 ENCOUNTER — Encounter: Payer: BLUE CROSS/BLUE SHIELD | Admitting: Pediatrics

## 2017-09-15 ENCOUNTER — Ambulatory Visit: Payer: 59 | Admitting: Family Medicine

## 2017-09-15 ENCOUNTER — Encounter: Payer: Self-pay | Admitting: Family Medicine

## 2017-09-15 VITALS — BP 127/82 | HR 79 | Temp 98.7°F | Ht 68.0 in | Wt 194.0 lb

## 2017-09-15 DIAGNOSIS — M7581 Other shoulder lesions, right shoulder: Secondary | ICD-10-CM

## 2017-09-15 MED ORDER — METHYLPREDNISOLONE ACETATE 80 MG/ML IJ SUSP
80.0000 mg | Freq: Once | INTRAMUSCULAR | Status: AC
  Administered 2017-09-15: 80 mg via INTRAMUSCULAR

## 2017-09-15 NOTE — Progress Notes (Signed)
BP 127/82   Pulse 79   Temp 98.7 F (37.1 C) (Oral)   Ht 5\' 8"  (1.727 m)   Wt 194 lb (88 kg)   BMI 29.50 kg/m    Subjective:    Patient ID: Tami Johnson, female    DOB: August 04, 1976, 41 y.o.   MRN: [id number]  HPI: Tami LeydenKristal L Porr is a 41 y.o. female presenting on 09/15/2017 for Arm Pain (right)   HPI Right arm pain Patient is having pain on her right lateral shoulder that extends down her arm up on top of the shoulder as well.  She has been having this for the past couple weeks that she can remember.  She has been using Tylenol and ibuprofen for it.  She cannot recall any specific inciting incident.  The Tylenol and ibuprofen have been helping some but not improving it.  She says it does hurt more with overhead range of motion brushing her hair.  She denies any fevers or chills or redness or warmth or swelling.  She denies any weakness or numbness in that arm although occasionally she will have a rash nerve like pain that was shoot down towards her hand but that is only been happening occasionally and rarely.  She denies any neck pain or head pain or pain with neck range of motion.  Relevant past medical, surgical, family and social history reviewed and updated as indicated. Interim medical history since our last visit reviewed. Allergies and medications reviewed and updated.  Review of Systems  Constitutional: Negative for chills and fever.  Eyes: Negative for visual disturbance.  Respiratory: Negative for chest tightness and shortness of breath.   Cardiovascular: Negative for chest pain and leg swelling.  Musculoskeletal: Positive for arthralgias. Negative for back pain, gait problem and joint swelling.  Skin: Negative for color change and rash.  Neurological: Negative for light-headedness and headaches.  Psychiatric/Behavioral: Negative for agitation and behavioral problems.  All other systems reviewed and are negative.   Per HPI unless specifically indicated  above        Objective:    BP 127/82   Pulse 79   Temp 98.7 F (37.1 C) (Oral)   Ht 5\' 8"  (1.727 m)   Wt 194 lb (88 kg)   BMI 29.50 kg/m   Wt Readings from Last 3 Encounters:  09/15/17 194 lb (88 kg)  08/26/16 184 lb 9.6 oz (83.7 kg)  08/16/16 184 lb (83.5 kg)    Physical Exam  Constitutional: She is oriented to person, place, and time. She appears well-developed and well-nourished. No distress.  Eyes: Conjunctivae are normal.  Cardiovascular: Normal rate, regular rhythm, normal heart sounds and intact distal pulses.  No murmur heard. Pulmonary/Chest: Effort normal and breath sounds normal. No respiratory distress. She has no wheezes.  Musculoskeletal: Normal range of motion. She exhibits no edema.       Right shoulder: She exhibits tenderness (Positive Hawkins impingement sign, positive pain with external rotation). She exhibits no bony tenderness, no swelling, no deformity and normal strength.  Neurological: She is alert and oriented to person, place, and time. No sensory deficit. She exhibits normal muscle tone. Coordination normal.  Skin: Skin is warm and dry. No rash noted. She is not diaphoretic.  Psychiatric: She has a normal mood and affect. Her behavior is normal.  Nursing note and vitals reviewed.       Assessment & Plan:   Problem List Items Addressed This Visit    None    Visit  Diagnoses    Rotator cuff tendinitis, right    -  Primary   Relevant Medications   methylPREDNISolone acetate (DEPO-MEDROL) injection 80 mg (Completed)       Follow up plan: Return if symptoms worsen or fail to improve.  Counseling provided for all of the vaccine components No orders of the defined types were placed in this encounter.   Arville CareJoshua Analina Filla, MD [facility] 09/15/2017, 5:53 PM

## 2017-09-28 ENCOUNTER — Encounter: Payer: Self-pay | Admitting: Pediatrics

## 2017-09-28 ENCOUNTER — Ambulatory Visit (INDEPENDENT_AMBULATORY_CARE_PROVIDER_SITE_OTHER): Payer: Managed Care, Other (non HMO) | Admitting: Pediatrics

## 2017-09-28 VITALS — BP 128/84 | HR 60 | Temp 97.6°F | Ht 68.0 in | Wt 191.4 lb

## 2017-09-28 DIAGNOSIS — Z0000 Encounter for general adult medical examination without abnormal findings: Secondary | ICD-10-CM

## 2017-09-28 DIAGNOSIS — B001 Herpesviral vesicular dermatitis: Secondary | ICD-10-CM

## 2017-09-28 DIAGNOSIS — Z6829 Body mass index (BMI) 29.0-29.9, adult: Secondary | ICD-10-CM | POA: Diagnosis not present

## 2017-09-28 DIAGNOSIS — I10 Essential (primary) hypertension: Secondary | ICD-10-CM | POA: Insufficient documentation

## 2017-09-28 DIAGNOSIS — N898 Other specified noninflammatory disorders of vagina: Secondary | ICD-10-CM

## 2017-09-28 LAB — WET PREP FOR TRICH, YEAST, CLUE
Clue Cell Exam: NEGATIVE
Trichomonas Exam: NEGATIVE
YEAST EXAM: NEGATIVE

## 2017-09-28 MED ORDER — VALACYCLOVIR HCL 1 G PO TABS: ORAL_TABLET | ORAL | 0 refills | Status: DC

## 2017-09-28 NOTE — Progress Notes (Signed)
  Subjective:   Patient ID: Tami Johnson, female    DOB: [birth year], 42 y.o.   MRN: [id number] CC: Annual Exam  HPI: Tami Johnson is a 42 y.o. female presenting for Annual Exam  Here today with her husband They have been trying to increase physical activity and change what they are eating to be more healthy   She has been feeling well overall  Mood has been fine  She follows with gynecology for Pap smears, up-to-date She has noticed more of an odor to her vaginal discharge in the last few days No change in amount or consistency or color of the discharge No dysuria  She takes metoprolol for palpitations, no recent symptoms  She does sometimes get herpes labialis outbreaks, Valtrex treats them and she would like a refill on this  Relevant past medical, surgical, family and social history reviewed. Allergies and medications reviewed and updated. Social History   Tobacco Use  Smoking Status Never Smoker  Smokeless Tobacco Never Used   ROS: All systems negative other than what is in HPI  Objective:    BP 128/84   Pulse 60   Temp 97.6 F (36.4 C) (Oral)   Ht '5\' 8"'$  (1.727 m)   Wt 191 lb 6.4 oz (86.8 kg)   BMI 29.10 kg/m   Wt Readings from Last 3 Encounters:  09/28/17 191 lb 6.4 oz (86.8 kg)  09/15/17 194 lb (88 kg)  08/26/16 184 lb 9.6 oz (83.7 kg)    Gen: NAD, alert, cooperative with exam, NCAT EYES: EOMI, no conjunctival injection, or no icterus ENT:  TMs pearly gray b/l, OP without erythema LYMPH: no cervical LAD CV: NRRR, normal S1/S2, no murmur, distal pulses 2+ b/l Resp: CTABL, no wheezes, normal WOB Abd: +BS, soft, NTND. no guarding or organomegaly Ext: No edema, warm Neuro: Alert and oriented, strength equal b/l UE and LE, coordination grossly normal MSK: normal muscle bulk  Assessment & Plan:  Mica was seen today for annual exam.  Diagnoses and all orders for this visit:  Encounter for preventive health examination -     CMP14+EGFR -      Lipid panel  BMI 29.0-29.9,adult Continue lifestyle changes, goal 5 pound weight loss before next visit Increase fruits and vegetables in diet, aiming for 20-30 minutes of exercise daily -     CMP14+EGFR -     Lipid panel  Herpes labialis Stable, continue below as needed -     valACYclovir (VALTREX) 1000 MG tablet; Take 2 tabs q12h for two doses for outbreaks  Vaginal discharge Negative wet prep Return for pelvic exam if symptoms are not improving over the next week, declines exam today -     WET PREP FOR [place], YEAST, CLUE   Follow up plan: 1 year, sooner if vaginal discharge does not improve Assunta Found, MD [place]

## 2017-09-29 LAB — CMP14+EGFR
A/G RATIO: 1.9 (ref 1.2–2.2)
ALK PHOS: 76 IU/L (ref 39–117)
ALT: 19 IU/L (ref 0–32)
AST: 12 IU/L (ref 0–40)
Albumin: 4.4 g/dL (ref 3.5–5.5)
BUN/Creatinine Ratio: 20 (ref 9–23)
BUN: 13 mg/dL (ref 6–24)
Bilirubin Total: 0.3 mg/dL (ref 0.0–1.2)
CHLORIDE: 103 mmol/L (ref 96–106)
CO2: 20 mmol/L (ref 20–29)
Calcium: 9.2 mg/dL (ref 8.7–10.2)
Creatinine, Ser: 0.66 mg/dL (ref 0.57–1.00)
GFR calc Af Amer: 128 mL/min/{1.73_m2} (ref >59)
GFR calc non Af Amer: 111 mL/min/{1.73_m2} (ref >59)
GLOBULIN, TOTAL: 2.3 g/dL (ref 1.5–4.5)
Glucose: 98 mg/dL (ref 65–99)
POTASSIUM: 4.3 mmol/L (ref 3.5–5.2)
SODIUM: 138 mmol/L (ref 134–144)
Total Protein: 6.7 g/dL (ref 6.0–8.5)

## 2017-09-29 LAB — LIPID PANEL
CHOL/HDL RATIO: 3.6 ratio (ref 0.0–4.4)
CHOLESTEROL TOTAL: 193 mg/dL (ref 100–199)
HDL: 54 mg/dL (ref >39)
LDL Calculated: 129 mg/dL — ABNORMAL HIGH (ref 0–99)
Triglycerides: 51 mg/dL (ref 0–149)
VLDL Cholesterol Cal: 10 mg/dL (ref 5–40)

## 2017-09-30 ENCOUNTER — Encounter: Payer: Self-pay | Admitting: Pediatrics

## 2017-10-25 ENCOUNTER — Other Ambulatory Visit: Payer: Self-pay | Admitting: Pediatrics

## 2017-10-25 MED ORDER — METOPROLOL TARTRATE 25 MG PO TABS: 25.0000 mg | ORAL_TABLET | Freq: Two times a day (BID) | ORAL | 3 refills | Status: DC

## 2017-10-25 MED ORDER — CETIRIZINE HCL 10 MG PO TABS: 10.0000 mg | ORAL_TABLET | Freq: Every day | ORAL | 3 refills | Status: DC

## 2017-10-25 NOTE — Telephone Encounter (Signed)
Patient states she has to now use [facility] due to insurance. Sent to correct pharmacy.

## 2017-11-23 ENCOUNTER — Encounter: Payer: Self-pay | Admitting: Nurse Practitioner

## 2017-11-23 ENCOUNTER — Ambulatory Visit: Payer: Managed Care, Other (non HMO) | Admitting: Nurse Practitioner

## 2017-11-23 VITALS — BP 132/86 | HR 65 | Temp 97.7°F | Ht 68.0 in | Wt 188.0 lb

## 2017-11-23 DIAGNOSIS — R519 Headache, unspecified: Secondary | ICD-10-CM

## 2017-11-23 DIAGNOSIS — R51 Headache: Secondary | ICD-10-CM | POA: Diagnosis not present

## 2017-11-23 MED ORDER — KETOROLAC TROMETHAMINE 60 MG/2ML IM SOLN
60.0000 mg | Freq: Once | INTRAMUSCULAR | Status: AC
  Administered 2017-11-23: 60 mg via INTRAMUSCULAR

## 2017-11-23 NOTE — Patient Instructions (Signed)

## 2017-11-23 NOTE — Progress Notes (Signed)
   Subjective:    Patient ID: Tami LeydenKristal L Lukens, female    DOB: 21-Apr-1976, 42 y.o.   MRN: [id number]  HPI Patientis brought in by her omm today with c/o headache. She has a history headaches when she was younger which usually occur with sinus infection. But she does not feel like that she has sinus infection today. Head started hurting yesterday.  Hurts all acorss forehead then radiates around head and down neck. Describes pain as achy/thumping pain. Currently rates 7/10. Noises make it worse. Closing eyes and being quiet helps. She tried ibuprofen 600mg  yesterday morning and some yesterday evening.    Review of Systems  Constitutional: Negative for chills and fever.  HENT: Negative for congestion, ear pain, sinus pressure, sinus pain, sore throat and trouble swallowing.   Eyes: Negative for visual disturbance.  Respiratory: Negative for cough.   Cardiovascular: Negative.   Gastrointestinal: Negative for nausea and vomiting.  Neurological: Positive for headaches. Negative for dizziness.  Psychiatric/Behavioral: Negative.   All other systems reviewed and are negative.      Objective:   Physical Exam  Constitutional: She is oriented to person, place, and time. She appears well-developed and well-nourished. She appears distressed (moderate).  HENT:  Right Ear: External ear normal.  Left Ear: External ear normal.  Nose: Nose normal.  Mouth/Throat: Oropharynx is clear and moist.  Cardiovascular: Normal rate.  Pulmonary/Chest: Effort normal.  Neurological: She is alert and oriented to person, place, and time. She has normal reflexes. No cranial nerve deficit.  Skin: Skin is warm.  Psychiatric: She has a normal mood and affect. Her behavior is normal. Judgment and thought content normal.   BP 132/86   Pulse 65   Temp 97.7 F (36.5 C) (Oral)   Ht 5\' 8"  (1.727 m)   Wt 188 lb (85.3 kg)   BMI 28.59 kg/m       Assessment & Plan:   1. Acute intractable headache, unspecified  headache type    Meds ordered this encounter  Medications  . ketorolac (TORADOL) injection 60 mg   Rest  Force fluids RTO prn  Mary-Margaret Daphine DeutscherMartin, FNP

## 2017-12-16 ENCOUNTER — Ambulatory Visit: Payer: Managed Care, Other (non HMO) | Admitting: Family

## 2017-12-16 ENCOUNTER — Encounter: Payer: Self-pay | Admitting: Family

## 2017-12-16 VITALS — BP 128/81 | HR 108 | Temp 98.7°F | Ht 68.0 in | Wt 191.0 lb

## 2017-12-16 DIAGNOSIS — J101 Influenza due to other identified influenza virus with other respiratory manifestations: Secondary | ICD-10-CM | POA: Diagnosis not present

## 2017-12-16 DIAGNOSIS — J029 Acute pharyngitis, unspecified: Secondary | ICD-10-CM

## 2017-12-16 DIAGNOSIS — R6889 Other general symptoms and signs: Secondary | ICD-10-CM

## 2017-12-16 MED ORDER — OSELTAMIVIR PHOSPHATE 75 MG PO CAPS: 75.0000 mg | ORAL_CAPSULE | Freq: Two times a day (BID) | ORAL | 0 refills | Status: DC

## 2017-12-16 NOTE — Addendum Note (Signed)
Addended by: Jannifer RodneyHAWKS, Nikitta Sobiech A on: 12/16/2017 09:07 AM   Modules accepted: Orders

## 2017-12-16 NOTE — Progress Notes (Addendum)
   Subjective:    Patient ID: Tami Johnson, female    DOB: Nov 06, 1975, 42 y.o.   MRN: [id number]  Headache   Associated symptoms include coughing, ear pain and rhinorrhea. Pertinent negatives include no nausea or sore throat.  URI   This is a new problem. The current episode started in the past 7 days. The problem has been gradually worsening. Maximum temperature: 99. Associated symptoms include congestion, coughing, ear pain, headaches, a plugged ear sensation, rhinorrhea, sinus pain and sneezing. Pertinent negatives include no nausea or sore throat. She has tried acetaminophen and decongestant for the symptoms. The treatment provided mild relief.      Review of Systems  HENT: Positive for congestion, ear pain, rhinorrhea, sinus pain and sneezing. Negative for sore throat.   Respiratory: Positive for cough.   Gastrointestinal: Negative for nausea.  Neurological: Positive for headaches.  All other systems reviewed and are negative.      Objective:   Physical Exam  Constitutional: She is oriented to person, place, and time. She appears well-developed and well-nourished. No distress.  HENT:  Head: Normocephalic and atraumatic.  Right Ear: External ear normal.  Left Ear: External ear normal.  Nose: Mucosal edema and rhinorrhea present.  Mouth/Throat: Posterior oropharyngeal erythema present.  Eyes: Pupils are equal, round, and reactive to light.  Neck: Normal range of motion. Neck supple. No thyromegaly present.  Cardiovascular: Normal rate, regular rhythm, normal heart sounds and intact distal pulses.  No murmur heard. Pulmonary/Chest: Effort normal and breath sounds normal. No respiratory distress. She has no wheezes.  Abdominal: Soft. Bowel sounds are normal. She exhibits no distension. There is no tenderness.  Musculoskeletal: Normal range of motion. She exhibits no edema or tenderness.  Neurological: She is alert and oriented to person, place, and time. She has normal  reflexes. No cranial nerve deficit.  Skin: Skin is warm and dry.  Psychiatric: She has a normal mood and affect. Her behavior is normal. Judgment and thought content normal.  Vitals reviewed.     BP 128/81   Pulse (!) 108   Temp 98.7 F (37.1 C) (Oral)   Ht 5\' 8"  (1.727 m)   Wt 191 lb (86.6 kg)   BMI 29.04 kg/m      Assessment & Plan:  1. Flu-like symptoms - Veritor Flu A/B Waived  2. Sore throat - Rapid Strep Screen (Not at [facility])  3. Influenza A Rest Force fluids Tylenol or motrin Good hand hygiene discussed, cover mouth when coughing or sneezing RTO prn or if symptoms worsen or do not improve - oseltamivir (TAMIFLU) 75 MG capsule; Take 1 capsule (75 mg total) by mouth 2 (two) times daily.  Dispense: 10 capsule; Refill: 0    Jannifer Rodneyhristy Fany Cavanaugh, FNP

## 2017-12-16 NOTE — Patient Instructions (Addendum)

## 2017-12-18 LAB — CULTURE, GROUP A STREP

## 2017-12-18 LAB — VERITOR FLU A/B WAIVED
INFLUENZA A: POSITIVE — AB
Influenza B: NEGATIVE

## 2017-12-18 LAB — RAPID STREP SCREEN (MED CTR MEBANE ONLY): Strep Gp A Ag, IA W/Reflex: NEGATIVE

## 2017-12-20 ENCOUNTER — Telehealth: Payer: Self-pay | Admitting: Pediatrics

## 2017-12-20 ENCOUNTER — Other Ambulatory Visit: Payer: Self-pay | Admitting: *Deleted

## 2017-12-20 MED ORDER — CEFDINIR 300 MG PO CAPS: 300.0000 mg | ORAL_CAPSULE | Freq: Two times a day (BID) | ORAL | 0 refills | Status: DC

## 2017-12-20 NOTE — Addendum Note (Signed)
Addended by: Arville CareETTINGER, Teighlor Korson on: 12/20/2017 12:49 PM   Modules accepted: Orders

## 2017-12-20 NOTE — Telephone Encounter (Signed)
Pt is having sinus pressure,headache, bilateral ear pain and teeth pain. Pt requesting antibiotic. Please review and advise Pt was seen on Saturday by Jannifer Rodneyhristy Hawks Offered appt Pt does not want to have to pay another $30 copay

## 2017-12-20 NOTE — Telephone Encounter (Signed)
[facility] and [facility] at [facility].

## 2017-12-20 NOTE — Telephone Encounter (Signed)
She can try Flonase nasal steroid spray, antihistamine such as cetirizine or Zyrtec, sinus rinses or saline solution.  If not improving should be seen

## 2017-12-20 NOTE — Telephone Encounter (Signed)
Patient seen Tami Johnson 3/23 and was prescribed tamiflu.  Patient states that she is still really congested, having bilateral ear pain and headache.  States her ears feel like they need to pop but can't. [facility] [place]. Please advise and send back to the pools.

## 2017-12-20 NOTE — Telephone Encounter (Signed)
Patient aware.

## 2018-02-12 ENCOUNTER — Encounter: Payer: Self-pay | Admitting: Pediatrics

## 2018-02-12 ENCOUNTER — Ambulatory Visit: Payer: Managed Care, Other (non HMO) | Admitting: Pediatrics

## 2018-02-12 VITALS — BP 140/87 | HR 65 | Temp 98.0°F | Ht 68.0 in | Wt 192.0 lb

## 2018-02-12 DIAGNOSIS — H9190 Unspecified hearing loss, unspecified ear: Secondary | ICD-10-CM | POA: Diagnosis not present

## 2018-02-12 DIAGNOSIS — R42 Dizziness and giddiness: Secondary | ICD-10-CM

## 2018-02-12 DIAGNOSIS — W19XXXA Unspecified fall, initial encounter: Secondary | ICD-10-CM

## 2018-02-12 DIAGNOSIS — R030 Elevated blood-pressure reading, without diagnosis of hypertension: Secondary | ICD-10-CM | POA: Diagnosis not present

## 2018-02-12 MED ORDER — METOPROLOL TARTRATE 25 MG PO TABS: 12.5000 mg | ORAL_TABLET | Freq: Two times a day (BID) | ORAL | 3 refills | Status: DC

## 2018-02-12 NOTE — Progress Notes (Signed)
  Subjective:   Patient ID: Tami Johnson, female    DOB: 08-31-1976, 42 y.o.   MRN: [id number] CC: Dizziness  HPI: Tami Johnson is a 42 y.o. female   Has dizziness that comes and goes, ongoing over the last few weeks.  Also has had worsening ringing in her ears.  She feels like the hearing comes and goes, will get louder and softer.  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  Her father also had some hearing loss.  She has had ringing in both of her years since she was young.  The dizziness happens primarily when she turns her head.  She feels room spinning first few seconds.  She has had some congestion over the last couple weeks.  No fevers.  Appetite is been fine.  She takes metoprolol twice a day for PACs.  Yesterday she went to put something in the refrigerator and she felt her body gave out and she ended up on the floor.  She did not hurt anything.  She is awake the entire time.  She is able to move right after getting onto the floor.  Her husband checked her blood pressure, 120/81.  Her heart rate was 51.  Relevant past medical, surgical, family and social history reviewed. Allergies and medications reviewed and updated. Social History   Tobacco Use  Smoking Status Never Smoker  Smokeless Tobacco Never Used   ROS: Per HPI   Objective:    BP 140/87   Pulse 65   Temp 98 F (36.7 C) (Oral)   Ht  (1.727 m)   Wt 192 lb (87.1 kg)   BMI 29.19 kg/m   Wt Readings from Last 3 Encounters:  02/12/18 192 lb (87.1 kg)  12/16/17 191 lb (86.6 kg)  11/23/17 188 lb (85.3 kg)    Not orthostatic  Gen: NAD, alert, cooperative with exam, NCAT EYES: EOMI, no conjunctival injection, or no icterus ENT: Right TM pearly gray, white effusion present.  Left TM lower quadrants scarred.  Fluid bubbles visible upper anterior quadrant.  No redness. OP without erythema LYMPH: no cervical LAD CV: NRRR, normal S1/S2, no murmur, distal pulses 2+ b/l Resp: CTABL,  no wheezes, normal WOB Abd: +BS, soft, NTND. Ext: No edema, warm Neuro: Alert and oriented, strength equal b/l UE and LE, coordination grossly normal MSK: normal muscle bulk  Assessment & Plan:  Tami Johnson was seen today for dizziness.  Diagnoses and all orders for this visit:  Vertigo Does have bilateral ear effusions.  Discussed restarting Flonase, if she uses it for long periods she gets sores in her nose, can tolerate for several days at a time.  Continue antihistamine daily. -     Ambulatory referral to ENT  Hearing loss, unspecified hearing loss type, unspecified laterality -     Ambulatory referral to ENT  Elevated blood pressure reading Check blood pressures at home daily, return to clinic in 4 weeks.  If regularly greater than 140 on top or greater than 90 on bottom let me know.  Fall, initial encounter No loss of consciousness.  Will decrease metoprolol to 12.5 mg twice a day.  Other orders -     metoprolol tartrate (LOPRESSOR) 25 MG tablet; Take 0.5 tablets (12.5 mg total) by mouth 2 (two) times daily.   Follow up plan: Return in about 1 month (around 03/12/2018).  Sooner if needed, return precautions discussed. Rex Kras, MD [facility]

## 2018-02-12 NOTE — Patient Instructions (Addendum)
Take flonase for three days. Use salt water spray or gel as needed.  Continue claritin or similar antihistamine  Decrease metoprolol to 12.5mg  twice a day  Check blood pressure at home, write down and bring to clinic in 4 weeks.  How to Perform the Epley Maneuver The Epley maneuver is an exercise that relieves symptoms of vertigo. Vertigo is the feeling that you or your surroundings are moving when they are not. When you feel vertigo, you may feel like the room is spinning and have trouble walking. Dizziness is a little different than vertigo. When you are dizzy, you may feel unsteady or light-headed. You can do this maneuver at home whenever you have symptoms of vertigo. You can do it up to 3 times a day until your symptoms go away. Even though the Epley maneuver may relieve your vertigo for a few weeks, it is possible that your symptoms will return. This maneuver relieves vertigo, but it does not relieve dizziness. What are the risks? If it is done correctly, the Epley maneuver is considered safe. Sometimes it can lead to dizziness or nausea that goes away after a short time. If you develop other symptoms, such as changes in vision, weakness, or numbness, stop doing the maneuver and call your health care provider. How to perform the Epley maneuver 1. Sit on the edge of a bed or table with your back straight and your legs extended or hanging over the edge of the bed or table. 2. Turn your head halfway toward the affected ear or side. 3. Lie backward quickly with your head turned until you are lying flat on your back. You may want to position a pillow under your shoulders. 4. Hold this position for 30 seconds. You may experience an attack of vertigo. This is normal. 5. Turn your head to the opposite direction until your unaffected ear is facing the floor. 6. Hold this position for 30 seconds. You may experience an attack of vertigo. This is normal. Hold this position until the vertigo  stops. 7. Turn your whole body to the same side as your head. Hold for another 30 seconds. 8. Sit back up. You can repeat this exercise up to 3 times a day. Follow these instructions at home:  After doing the Epley maneuver, you can return to your normal activities.  Ask your health care provider if there is anything you should do at home to prevent vertigo. He or she may recommend that you: ? Keep your head raised (elevated) with two or more pillows while you sleep. ? Do not sleep on the side of your affected ear. ? Get up slowly from bed. ? Avoid sudden movements during the day. ? Avoid extreme head movement, like looking up or bending over. Contact a health care provider if:  Your vertigo gets worse.  You have other symptoms, including: ? Nausea. ? Vomiting. ? Headache. Get help right away if:  You have vision changes.  You have a severe or worsening headache or neck pain.  You cannot stop vomiting.  You have new numbness or weakness in any part of your body. Summary  Vertigo is the feeling that you or your surroundings are moving when they are not.  The Epley maneuver is an exercise that relieves symptoms of vertigo.  If the Epley maneuver is done correctly, it is considered safe. You can do it up to 3 times a day. This information is not intended to replace advice given to you by your health  care provider. Make sure you discuss any questions you have with your health care provider. Document Released: 09/17/2013 Document Revised: 08/02/2016 Document Reviewed: 08/02/2016 Elsevier Interactive Patient Education  2017 [employer]

## 2018-02-14 ENCOUNTER — Telehealth: Payer: Self-pay | Admitting: Pediatrics

## 2018-02-14 NOTE — Telephone Encounter (Signed)
Ok to put in referral to other

## 2018-02-16 ENCOUNTER — Telehealth: Payer: Self-pay | Admitting: Pediatrics

## 2018-02-16 ENCOUNTER — Other Ambulatory Visit: Payer: Self-pay | Admitting: *Deleted

## 2018-02-16 DIAGNOSIS — H9190 Unspecified hearing loss, unspecified ear: Secondary | ICD-10-CM

## 2018-02-16 NOTE — Telephone Encounter (Signed)
Patient has appt with Dr. Ezzard Standing next week, last ov and referral faxed to Dr. Ezzard Standing

## 2018-02-16 NOTE — Telephone Encounter (Signed)
Can you put in referral to alternate ENT? Dr Suszanne Conners had a several month wait time per pt

## 2018-02-16 NOTE — Telephone Encounter (Signed)
Do you know about any paperwork? Please review and advise

## 2018-05-17 ENCOUNTER — Ambulatory Visit: Payer: Managed Care, Other (non HMO) | Admitting: Pediatrics

## 2018-05-17 ENCOUNTER — Encounter: Payer: Self-pay | Admitting: Pediatrics

## 2018-05-17 ENCOUNTER — Ambulatory Visit: Payer: Managed Care, Other (non HMO)

## 2018-05-17 VITALS — BP 132/85 | HR 63 | Temp 97.9°F | Ht 68.0 in | Wt 196.6 lb

## 2018-05-17 DIAGNOSIS — R200 Anesthesia of skin: Secondary | ICD-10-CM | POA: Diagnosis not present

## 2018-05-17 DIAGNOSIS — G510 Bell's palsy: Secondary | ICD-10-CM

## 2018-05-17 MED ORDER — VALACYCLOVIR HCL 1 G PO TABS: 1000.0000 mg | ORAL_TABLET | Freq: Three times a day (TID) | ORAL | 0 refills | Status: DC

## 2018-05-17 MED ORDER — PREDNISONE 20 MG PO TABS: ORAL_TABLET | ORAL | 0 refills | Status: DC

## 2018-05-17 NOTE — Progress Notes (Signed)
  Subjective:   Patient ID: Tami Johnson, female    DOB: November 30, 1975, 42 y.o.   MRN: [id number] CC: Facial Numbness  HPI: Tami Johnson is a 42 y.o. female   Numbness started L side of her face on her temple yesterday at work. Noticed today down into her L cheek. This morning she noticed decreased nasolabial fold L side compared to right.   Vision is fine. No headaches. No hearing changes. No trouble walking, no numbness or weakness elsewhere on body. No trouble with speech. No fevers.   Relevant past medical, surgical, family and social history reviewed. Allergies and medications reviewed and updated. Social History   Tobacco Use  Smoking Status Never Smoker  Smokeless Tobacco Never Used   ROS: Per HPI   Objective:    BP 132/85   Pulse 63   Temp 97.9 F (36.6 C) (Oral)   Ht 5\' 8"  (1.727 m)   Wt 196 lb 9.6 oz (89.2 kg)   BMI 29.89 kg/m   Wt Readings from Last 3 Encounters:  05/17/18 196 lb 9.6 oz (89.2 kg)  02/12/18 192 lb (87.1 kg)  12/16/17 191 lb (86.6 kg)    Gen: NAD, alert, cooperative with exam, NCAT EYES: EOMI, no conjunctival injection, or no icterus ENT:  TMs pearly gray b/l, OP without erythema LYMPH: no cervical LAD CV: NRRR, normal S1/S2, no murmur, distal pulses 2+ b/l Resp: CTABL, no wheezes, normal WOB Abd: +BS, soft, NTND.  Ext: No edema, warm Neuro: Alert and oriented, strength equal b/l UE and LE, coordination grossly normal. Equal eyebrow raise b/l, L eye lid slightly drooped compared to R. L nasolabial fold slightly slackened compared to R. Decreased sensation from L temple extending distally to mid L cheek.  MSK: normal muscle bulk  Assessment & Plan:  Tami Johnson was seen today for facial numbness.  Diagnoses and all orders for this visit:  Left facial numbness  Bell's palsy L sided facial weakness, numbness with sparing of forehead. Will treat with below for Bells palsy. Follow BPs at home, bring to clinic next visit. Return  precautions discussed at length, any worsening symptoms or new symptoms needs to be seen immediately. -     predniSONE (DELTASONE) 20 MG tablet; Take 3 tabs for 7 days, then 2 tabs for 3 days, then 1 tab for 3 days -     valACYclovir (VALTREX) 1000 MG tablet; Take 1 tablet (1,000 mg total) by mouth 3 (three) times daily.   Follow up plan: Return in about 3 weeks (around 06/07/2018). Tami Krasarol Vincent, MD [facility]

## 2018-05-17 NOTE — Patient Instructions (Signed)
Check blood pressures at home. Write down and bring to next appt.

## 2018-06-01 ENCOUNTER — Ambulatory Visit (INDEPENDENT_AMBULATORY_CARE_PROVIDER_SITE_OTHER): Payer: Managed Care, Other (non HMO) | Admitting: Pediatrics

## 2018-06-01 ENCOUNTER — Encounter: Payer: Self-pay | Admitting: Pediatrics

## 2018-06-01 VITALS — BP 135/85 | HR 67 | Temp 97.5°F | Ht 68.0 in | Wt 196.0 lb

## 2018-06-01 DIAGNOSIS — R200 Anesthesia of skin: Secondary | ICD-10-CM

## 2018-06-01 DIAGNOSIS — R29898 Other symptoms and signs involving the musculoskeletal system: Secondary | ICD-10-CM | POA: Diagnosis not present

## 2018-06-01 DIAGNOSIS — R29818 Other symptoms and signs involving the nervous system: Secondary | ICD-10-CM | POA: Diagnosis not present

## 2018-06-01 DIAGNOSIS — R51 Headache: Secondary | ICD-10-CM | POA: Diagnosis not present

## 2018-06-01 DIAGNOSIS — R519 Headache, unspecified: Secondary | ICD-10-CM

## 2018-06-01 DIAGNOSIS — R030 Elevated blood-pressure reading, without diagnosis of hypertension: Secondary | ICD-10-CM

## 2018-06-01 MED ORDER — CYCLOBENZAPRINE HCL 5 MG PO TABS: 5.0000 mg | ORAL_TABLET | Freq: Two times a day (BID) | ORAL | 1 refills | Status: DC | PRN

## 2018-06-01 NOTE — Progress Notes (Signed)
Subjective:   Patient ID: Tami Johnson, female    DOB: [birth year], 42 y.o.   MRN: [id number] CC: Facial Numbness (Left side, 2 week recheck) and Headache (sharp pain right temple)  HPI: Tami Johnson is a 42 y.o. female   Seen 2 weeks ago with left temple facial numbness, slight flattening of left nasolabial fold, slight left-sided ptosis.  No numbness or weakness forehead.  Patient was treated with prednisone and Valtrex for Bell's palsy.  No worsening numbness or weakness.  No new symptoms.  No weakness one side of the body or the other.  No slurring of speech or trouble with walking.  While on the medicine she started having headaches, hurt all over her head.  Was checking blood pressures at home, systolics 130s up to 150s rarely.  Since stopping the medicine has been 110s over 70s.  About a week ago she started getting a new right sided sharp pain in her right temple.  Lasts for minutes at a time.  Usually happens just about once a day, often in the evening but today happened earlier in the day.  Putting a finger on her temple helps relieve the pain.  Ibuprofen helps some.  Relevant past medical, surgical, family and social history reviewed. Allergies and medications reviewed and updated. Social History   Tobacco Use  Smoking Status Never Smoker  Smokeless Tobacco Never Used   ROS: Per HPI   Objective:    BP 135/85   Pulse 67   Temp (!) 97.5 F (36.4 C) (Oral)   Ht 5\' 8"  (1.727 m)   Wt 196 lb (88.9 kg)   BMI 29.80 kg/m   Wt Readings from Last 3 Encounters:  06/01/18 196 lb (88.9 kg)  05/17/18 196 lb 9.6 oz (89.2 kg)  02/12/18 192 lb (87.1 kg)    Gen: NAD, alert, cooperative with exam, NCAT EYES: EOMI, no conjunctival injection, or no icterus ENT: OP without erythema LYMPH: no cervical LAD CV: NRRR, normal S1/S2, no murmur, distal pulses 2+ b/l Resp: CTABL, no wheezes, normal WOB Ext: No edema, warm Neuro: Alert and oriented, slight flattening left  nasolabial fold, slight ptosis left eye compared to right.  Numb left temple, in front of left ear.  Normal sensation bilateral forehead, medial cheeks, chin, coordination grossly normal  Assessment & Plan:  [name] was seen today for facial numbness and headache.  Diagnoses and all orders for this visit: Nonintractable headache, unspecified chronicity pattern, unspecified headache type Left facial numbness Other symptoms and signs involving the nervous system Treated for Bell's palsy, now with new headaches.  First headache, all over her head improved greatly after stopping prednisone.  Suspect elevated blood pressures on prednisone contributing.  Now with new right temple shooting pain.  We will go ahead and get MRI as below.  Continues to have focal neurologic deficits.  Patient to call me to follow up with symptoms by phone in 2 weeks. -     MR Brain W Wo Contrast; Future  Neck tightness Trial below, start with half a tab.  May cause drowsiness, do not take and drive. -     cyclobenzaprine (FLEXERIL) 5 MG tablet; Take 1 tablet (5 mg total) by mouth 2 (two) times daily as needed for muscle spasms.  Elevated blood pressure reading Continue to check blood pressures at home.  Let me know if regularly greater than 130 or regularly greater than 80.   Follow up plan: Return in about 2 months (around 08/01/2018). Okey Regal  Evette Doffing, [place]

## 2018-06-01 NOTE — Patient Instructions (Signed)
Call me with symptoms in 2 weeks

## 2018-06-07 ENCOUNTER — Telehealth: Payer: Self-pay | Admitting: Pediatrics

## 2018-06-07 NOTE — Telephone Encounter (Signed)
Take 800mg  of motrin otc and make follow up appointmnet with dr. Oswaldo DoneVincent

## 2018-06-07 NOTE — Telephone Encounter (Signed)
Covering for Vincent 

## 2018-06-07 NOTE — Telephone Encounter (Signed)
Patient was given muscle relaxer for Headaches last week from doctor [place]. Patient is still getting headaches [facility]. Please advise.

## 2018-06-08 NOTE — Telephone Encounter (Signed)
LM for pt with MMM directions

## 2018-06-13 ENCOUNTER — Telehealth: Payer: Self-pay | Admitting: Pediatrics

## 2018-06-13 NOTE — Telephone Encounter (Signed)
MRI was normal, which is great. Sorry for delay, done outside of [facility] system so did not immediately come to my attention.

## 2018-06-13 NOTE — Telephone Encounter (Signed)
Aware of MRI result.  Muscle relaxer helps some but she would like to have something that would relieve her pain more. Please advise.

## 2018-08-27 ENCOUNTER — Ambulatory Visit: Payer: Managed Care, Other (non HMO) | Admitting: Family Medicine

## 2018-08-27 ENCOUNTER — Encounter: Payer: Self-pay | Admitting: Family Medicine

## 2018-08-27 VITALS — BP 136/89 | HR 95 | Temp 97.8°F | Ht 68.0 in | Wt 197.0 lb

## 2018-08-27 DIAGNOSIS — R0789 Other chest pain: Secondary | ICD-10-CM | POA: Diagnosis not present

## 2018-08-27 MED ORDER — DICLOFENAC SODIUM 75 MG PO TBEC: 75.0000 mg | DELAYED_RELEASE_TABLET | Freq: Two times a day (BID) | ORAL | 0 refills | Status: DC | PRN

## 2018-08-27 MED ORDER — TIZANIDINE HCL 4 MG PO TABS: 2.0000 mg | ORAL_TABLET | Freq: Four times a day (QID) | ORAL | 0 refills | Status: DC | PRN
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
## 2018-10-18 ENCOUNTER — Other Ambulatory Visit: Payer: Self-pay | Admitting: Pediatrics

## 2018-10-20 ENCOUNTER — Other Ambulatory Visit: Payer: Self-pay | Admitting: Pediatrics

## 2018-11-13 ENCOUNTER — Encounter: Payer: Self-pay | Admitting: Nurse Practitioner

## 2018-11-13 ENCOUNTER — Ambulatory Visit: Payer: Managed Care, Other (non HMO) | Admitting: Nurse Practitioner

## 2018-11-13 VITALS — BP 139/79 | HR 87 | Temp 98.2°F | Ht 68.0 in | Wt 201.0 lb

## 2018-11-13 DIAGNOSIS — J0101 Acute recurrent maxillary sinusitis: Secondary | ICD-10-CM | POA: Diagnosis not present

## 2018-11-13 MED ORDER — AMOXICILLIN-POT CLAVULANATE 875-125 MG PO TABS: 1.0000 | ORAL_TABLET | Freq: Two times a day (BID) | ORAL | 0 refills | Status: DC

## 2018-11-13 NOTE — Progress Notes (Signed)
Subjective:    Patient ID: [name], female    DOB: 1976-08-21, 43 y.o.   MRN: [id number]   Chief Complaint: Sinus Problem   HPI Patient come sin c/o chest congestion and facial pressure.started last night. Nose is completely stopped up and feels like stuff is draining down her throat . This is the third time she has had this in the last 3 weeks.                                                                                                                                                                                                                                                                                                                                              Review of Systems  Constitutional: Negative for chills and fever.  HENT: Positive for congestion, sinus pressure and sinus pain. Negative for sore throat and trouble swallowing.   Respiratory: Positive for cough (productive- greenish).   Musculoskeletal: Negative.   Neurological: Positive for headaches.  Psychiatric/Behavioral: Negative.   All other systems reviewed and are negative.      Objective:   Physical Exam Vitals signs and nursing note reviewed.  Constitutional:      General: She is not in acute distress.    Appearance: She is normal weight.  HENT:     Right Ear: Hearing, tympanic membrane, ear canal and external ear normal.     Left Ear: Hearing, tympanic membrane, ear canal and external ear normal.     Nose: Mucosal edema, congestion and rhinorrhea present.     Right Turbinates: Swollen.     Left Turbinates: Swollen.     Right Sinus: Frontal sinus tenderness present. No maxillary sinus tenderness.     Left Sinus: Frontal sinus tenderness present. No maxillary sinus tenderness.     Mouth/Throat:     Lips: Pink.     Pharynx:  Oropharynx is clear. No posterior oropharyngeal erythema.  Neck:     Musculoskeletal: Normal range of motion and neck supple.  Cardiovascular:     Rate and  Rhythm: Normal rate and regular rhythm.     Heart sounds: Normal heart sounds.  Pulmonary:     Effort: Pulmonary effort is normal.     Breath sounds: Normal breath sounds.  Lymphadenopathy:     Cervical: No cervical adenopathy.  Skin:    General: Skin is warm.  Neurological:     General: No focal deficit present.     Mental Status: She is alert and oriented to person, place, and time.  Psychiatric:        Mood and Affect: Mood normal.        Behavior: Behavior normal.    BP 139/79   Pulse 87   Temp 98.2 F (36.8 C) (Oral)   Ht 5\' 8"  (1.727 m)   Wt 201 lb (91.2 kg)   BMI 30.56 kg/m         Assessment & Plan:  [name] in today with chief complaint of Sinus Problem   1. Acute recurrent maxillary sinusitis 1. Take meds as prescribed 2. Use a cool mist humidifier especially during the winter months and when heat has been humid. 3. Use saline nose sprays frequently 4. Saline irrigations of the nose can be very helpful if done frequently.  * 4X daily for 1 week*  * Use of a nettie pot can be helpful with this. Follow directions with this* 5. Drink plenty of fluids 6. Keep thermostat turn down low 7.For any cough or congestion  Use plain Mucinex- regular strength or max strength is fine   * Children- consult with Pharmacist for dosing 8. For fever or aces or pains- take tylenol or ibuprofen appropriate for age and weight.  * for fevers greater than 101 orally you may alternate ibuprofen and tylenol every  3 hours.    - amoxicillin-clavulanate (AUGMENTIN) 875-125 MG tablet; Take 1 tablet by mouth 2 (two) times daily.  Dispense: 14 tablet; Refill: 0  Mary-Margaret Daphine Deutscher, FNP

## 2018-11-13 NOTE — Patient Instructions (Signed)

## 2018-11-14 ENCOUNTER — Telehealth: Payer: Self-pay | Admitting: Family Medicine

## 2018-11-14 ENCOUNTER — Encounter: Payer: Self-pay | Admitting: *Deleted

## 2018-11-14 ENCOUNTER — Other Ambulatory Visit: Payer: Self-pay | Admitting: Physician Assistant

## 2018-11-14 MED ORDER — BENZONATATE 200 MG PO CAPS: 200.0000 mg | ORAL_CAPSULE | Freq: Two times a day (BID) | ORAL | 0 refills | Status: DC | PRN

## 2018-11-14 NOTE — Telephone Encounter (Signed)
Note was given, but she is more concerned with something for cough. It was part of the note.

## 2018-11-14 NOTE — Telephone Encounter (Signed)
Can do delsym over the counter for cough

## 2018-11-14 NOTE — Telephone Encounter (Signed)
Script is sent for tessalon perles.  Ask if she needs an inhaler for tight short of breath cough.  If she does, may call in albuterol MDI 2 puffs QID for cough or wheeze #1.

## 2018-11-14 NOTE — Telephone Encounter (Signed)
Can give note 

## 2018-11-14 NOTE — Telephone Encounter (Signed)
Please advise 

## 2018-11-14 NOTE — Telephone Encounter (Signed)
Pharmacy [facility]   Pt states she was seen yesterday for sinus infection and was told to take OTC cough syrup pt has and states that it is not helping would like to have something sent to pharmacy

## 2018-11-14 NOTE — Telephone Encounter (Signed)
Seen for sinus problems yesterday.  Please advise on extended note to cover through Thursday and back to work Friday.

## 2018-11-15 ENCOUNTER — Encounter: Payer: Self-pay | Admitting: *Deleted

## 2018-11-15 NOTE — Telephone Encounter (Signed)
Pt called and aware of pearles. She is aware to call back if she does want the inhaler.

## 2018-11-15 NOTE — Telephone Encounter (Signed)
Please review

## 2018-11-16 ENCOUNTER — Telehealth: Payer: Self-pay | Admitting: Family Medicine

## 2018-11-17 ENCOUNTER — Other Ambulatory Visit: Payer: Self-pay | Admitting: Physician Assistant

## 2018-11-17 MED ORDER — DOXYCYCLINE HYCLATE 100 MG PO TABS: 100.0000 mg | ORAL_TABLET | Freq: Two times a day (BID) | ORAL | 0 refills | Status: DC

## 2018-11-17 MED ORDER — PREDNISONE 10 MG (21) PO TBPK: ORAL_TABLET | ORAL | 0 refills | Status: DC

## 2018-11-17 MED ORDER — ALBUTEROL SULFATE HFA 108 (90 BASE) MCG/ACT IN AERS: 2.0000 | INHALATION_SPRAY | Freq: Four times a day (QID) | RESPIRATORY_TRACT | 2 refills | Status: DC | PRN

## 2018-11-17 NOTE — Telephone Encounter (Signed)
Pt aware meds at [facility]

## 2018-11-17 NOTE — Telephone Encounter (Signed)
Sent doxycycline and sterapred pack, does she need an inhaler for wheezing?

## 2018-11-17 NOTE — Telephone Encounter (Signed)
States - she does not need inhaler

## 2018-12-28 ENCOUNTER — Other Ambulatory Visit: Payer: Self-pay | Admitting: Pediatrics

## 2019-01-14 ENCOUNTER — Telehealth: Payer: Self-pay | Admitting: Family Medicine

## 2019-01-14 MED ORDER — CETIRIZINE HCL 10 MG PO TABS: 10.0000 mg | ORAL_TABLET | Freq: Every day | ORAL | 0 refills | Status: DC

## 2019-01-14 MED ORDER — CETIRIZINE HCL 10 MG PO TABS: 10.0000 mg | ORAL_TABLET | Freq: Every day | ORAL | 1 refills | Status: DC

## 2019-01-14 NOTE — Telephone Encounter (Signed)
What is the name of the medication?cetrizine/zyrtec  Have you contacted your pharmacy to request a refill?yes  Which pharmacy would you like this sent to? [facility] [address] [facility] [phone]   Patient notified that their request is being sent to the clinical staff for review and that they should receive a call once it is complete. If they do not receive a call within 24 hours they can check with their pharmacy or our office.

## 2019-01-14 NOTE — Telephone Encounter (Signed)
Pt aware refill sent to pharmacy 

## 2019-01-30 ENCOUNTER — Other Ambulatory Visit: Payer: Self-pay | Admitting: Nurse Practitioner

## 2019-01-30 NOTE — Telephone Encounter (Signed)
Pt is not out of med yet, she will call for appt before she runs out

## 2019-01-30 NOTE — Telephone Encounter (Signed)
[facility]. NTBS. 30 days given 12/28/18

## 2019-06-20 ENCOUNTER — Other Ambulatory Visit: Payer: Self-pay

## 2019-06-20 DIAGNOSIS — Z20822 Contact with and (suspected) exposure to covid-19: Secondary | ICD-10-CM

## 2019-06-21 LAB — NOVEL CORONAVIRUS, NAA: SARS-CoV-2, NAA: NOT DETECTED

## 2019-07-04 ENCOUNTER — Other Ambulatory Visit: Payer: Self-pay

## 2019-07-04 DIAGNOSIS — Z20822 Contact with and (suspected) exposure to covid-19: Secondary | ICD-10-CM

## 2019-07-05 LAB — NOVEL CORONAVIRUS, NAA: SARS-CoV-2, NAA: NOT DETECTED

## 2019-07-09 ENCOUNTER — Other Ambulatory Visit: Payer: Self-pay | Admitting: Obstetrics and Gynecology

## 2019-07-09 DIAGNOSIS — Z1231 Encounter for screening mammogram for malignant neoplasm of breast: Secondary | ICD-10-CM

## 2019-07-16 ENCOUNTER — Ambulatory Visit: Payer: Managed Care, Other (non HMO) | Admitting: Family

## 2019-07-23 ENCOUNTER — Other Ambulatory Visit: Payer: Self-pay | Admitting: Nurse Practitioner

## 2019-07-25 DIAGNOSIS — N760 Acute vaginitis: Secondary | ICD-10-CM | POA: Insufficient documentation

## 2019-08-19 ENCOUNTER — Other Ambulatory Visit: Payer: Self-pay | Admitting: Family Medicine

## 2019-08-19 ENCOUNTER — Telehealth: Payer: Self-pay | Admitting: Family Medicine

## 2019-08-19 MED ORDER — METOPROLOL TARTRATE 25 MG PO TABS: 25.0000 mg | ORAL_TABLET | Freq: Two times a day (BID) | ORAL | 0 refills | Status: DC

## 2019-08-19 NOTE — Telephone Encounter (Signed)
Patient does not want to do a telephone visit and would like to get a CPE. Apt scheduled 1/4 and would like to know if she can have a refill of her bp medication until her apt? Please advise and send back to pools.

## 2019-08-20 ENCOUNTER — Ambulatory Visit: Payer: Managed Care, Other (non HMO) | Admitting: Family Medicine

## 2019-08-21 ENCOUNTER — Other Ambulatory Visit: Payer: Self-pay

## 2019-08-21 ENCOUNTER — Ambulatory Visit
Admission: RE | Admit: 2019-08-21 | Discharge: 2019-08-21 | Disposition: A | Discharge status: Home / Self Care | Payer: Managed Care, Other (non HMO) | Source: Ambulatory Visit | Attending: Obstetrics and Gynecology | Admitting: Obstetrics and Gynecology

## 2019-08-21 DIAGNOSIS — Z1231 Encounter for screening mammogram for malignant neoplasm of breast: Secondary | ICD-10-CM

## 2019-08-26 ENCOUNTER — Ambulatory Visit: Payer: Managed Care, Other (non HMO) | Admitting: Family Medicine

## 2019-09-30 ENCOUNTER — Encounter: Payer: Self-pay | Admitting: Family Medicine

## 2019-09-30 ENCOUNTER — Other Ambulatory Visit: Payer: Self-pay

## 2019-09-30 ENCOUNTER — Ambulatory Visit (INDEPENDENT_AMBULATORY_CARE_PROVIDER_SITE_OTHER): Payer: Managed Care, Other (non HMO) | Admitting: Family Medicine

## 2019-09-30 VITALS — BP 131/88 | HR 63 | Temp 99.1°F | Ht 69.0 in | Wt 206.0 lb

## 2019-09-30 DIAGNOSIS — K219 Gastro-esophageal reflux disease without esophagitis: Secondary | ICD-10-CM

## 2019-09-30 DIAGNOSIS — R238 Other skin changes: Secondary | ICD-10-CM | POA: Diagnosis not present

## 2019-09-30 DIAGNOSIS — Z0000 Encounter for general adult medical examination without abnormal findings: Secondary | ICD-10-CM

## 2019-09-30 DIAGNOSIS — E669 Obesity, unspecified: Secondary | ICD-10-CM | POA: Diagnosis not present

## 2019-09-30 DIAGNOSIS — E66811 Obesity, class 1: Secondary | ICD-10-CM

## 2019-09-30 DIAGNOSIS — R252 Cramp and spasm: Secondary | ICD-10-CM

## 2019-09-30 DIAGNOSIS — Z13228 Encounter for screening for other metabolic disorders: Secondary | ICD-10-CM

## 2019-09-30 DIAGNOSIS — Z13220 Encounter for screening for lipoid disorders: Secondary | ICD-10-CM

## 2019-09-30 DIAGNOSIS — Z130 Encounter for screening for diseases of the blood and blood-forming organs and certain disorders involving the immune mechanism: Secondary | ICD-10-CM

## 2019-09-30 DIAGNOSIS — Z1329 Encounter for screening for other suspected endocrine disorder: Secondary | ICD-10-CM

## 2019-09-30 MED ORDER — METOPROLOL TARTRATE 25 MG PO TABS: 25.0000 mg | ORAL_TABLET | Freq: Two times a day (BID) | ORAL | 3 refills | Status: DC

## 2019-09-30 MED ORDER — PANTOPRAZOLE SODIUM 40 MG PO TBEC: 40.0000 mg | DELAYED_RELEASE_TABLET | Freq: Every day | ORAL | 0 refills | Status: DC

## 2019-09-30 MED ORDER — CETIRIZINE HCL 10 MG PO TABS: 10.0000 mg | ORAL_TABLET | Freq: Every day | ORAL | 3 refills | Status: DC

## 2019-09-30 NOTE — Patient Instructions (Addendum)
Send me a MyChart message or call the office in a couple of weeks let me know if the pantoprazole is working.  I would like you to take this once daily for the next 2 weeks and then you can try and discontinue.  If your acid reflux comes back and you are having this several times per week I would go back on the pantoprazole indefinitely.  Let me know if I need to send in refills for you.  I have placed a referral to dermatology for the spot on the back of your right calf.  I am checking a few vitamin levels to see if perhaps this is what is causing your feet cramping.  Make sure that you are hydrating adequately.   Leg Cramps Leg cramps occur when one or more muscles tighten and you have no control over this tightening (involuntary muscle contraction). Muscle cramps can develop in any muscle, but the most common place is in the calf muscles of the leg. Those cramps can occur during exercise or when you are at rest. Leg cramps are painful, and they may last for a few seconds to a few minutes. Cramps may return several times before they finally stop. Usually, leg cramps are not caused by a serious medical problem. In many cases, the cause is not known. Some common causes include:  Excessive physical effort (overexertion), such as during intense exercise.  Overuse from repetitive motions, or doing the same thing over and over.  Staying in a certain position for a long period of time.  Improper preparation, form, or technique while performing a sport or an activity.  Dehydration.  Injury.  Side effects of certain medicines.  Abnormally low levels of minerals in your blood (electrolytes), especially potassium and calcium. This could result from: ? Pregnancy. ? Taking diuretic medicines. Follow these instructions at home: Eating and drinking  Drink enough fluid to keep your urine pale yellow. Staying hydrated may help prevent cramps.  Eat a healthy diet that includes plenty of nutrients to  help your muscles function. A healthy diet includes fruits and vegetables, lean protein, whole grains, and low-fat or nonfat dairy products. Managing pain, stiffness, and swelling      Try massaging, stretching, and relaxing the affected muscle. Do this for several minutes at a time.  If directed, put ice on areas that are sore or painful after a cramp: ? Put ice in a plastic bag. ? Place a towel between your skin and the bag. ? Leave the ice on for 20 minutes, 2-3 times a day.  If directed, apply heat to muscles that are tense or tight. Do this before you exercise, or as often as told by your health care provider. Use the heat source that your health care provider recommends, such as a moist heat pack or a heating pad. ? Place a towel between your skin and the heat source. ? Leave the heat on for 20-30 minutes. ? Remove the heat if your skin turns bright red. This is especially important if you are unable to feel pain, heat, or cold. You may have a greater risk of getting burned.  Try taking hot showers or baths to help relax tight muscles. General instructions  If you are having frequent leg cramps, avoid intense exercise for several days.  Take over-the-counter and prescription medicines only as told by your health care provider.  Keep all follow-up visits as told by your health care provider. This is important. Contact a health care  provider if:  Your leg cramps get more severe or more frequent, or they do not improve over time.  Your foot becomes cold, numb, or blue. Summary  Muscle cramps can develop in any muscle, but the most common place is in the calf muscles of the leg.  Leg cramps are painful, and they may last for a few seconds to a few minutes.  Usually, leg cramps are not caused by a serious medical problem. Often, the cause is not known.  Stay hydrated and take over-the-counter and prescription medicines only as told by your health care provider. This  information is not intended to replace advice given to you by your health care provider. Make sure you discuss any questions you have with your health care provider. Document Revised: 08/25/2017 Document Reviewed: 06/22/2017 Elsevier Patient Education  2020 Else[employer] Inc[employer]   Preventive Care 81-17 Years Old, Female Preventive care refers to visits with your health care provider and lifestyle choices that can promote health and wellness. This includes:  A yearly physical exam. This may also be called an annual well check.  Regular dental visits and eye exams.  Immunizations.  Screening for certain conditions.  Healthy lifestyle choices, such as eating a healthy diet, getting regular exercise, not using drugs or products that contain nicotine and tobacco, and limiting alcohol use. What can I expect for my preventive care visit? Physical exam Your health care provider will check your:  Height and weight. This may be used to calculate body mass index (BMI), which tells if you are at a healthy weight.  Heart rate and blood pressure.  Skin for abnormal spots. Counseling Your health care provider may ask you questions about your:  Alcohol, tobacco, and drug use.  Emotional well-being.  Home and relationship well-being.  Sexual activity.  Eating habits.  Work and work Statistician.  Method of birth control.  Menstrual cycle.  Pregnancy history. What immunizations do I need?  Influenza (flu) vaccine  This is recommended every year. Tetanus, diphtheria, and pertussis (Tdap) vaccine  You may need a Td booster every 10 years. Varicella (chickenpox) vaccine  You may need this if you have not been vaccinated. Zoster (shingles) vaccine  You may need this after age 56. Measles, mumps, and rubella (MMR) vaccine  You may need at least one dose of MMR if you were born in 1957 or later. You may also need a second dose. Pneumococcal conjugate (PCV13) vaccine  You may need  this if you have certain conditions and were not previously vaccinated. Pneumococcal polysaccharide (PPSV23) vaccine  You may need one or two doses if you smoke cigarettes or if you have certain conditions. Meningococcal conjugate (MenACWY) vaccine  You may need this if you have certain conditions. Hepatitis A vaccine  You may need this if you have certain conditions or if you travel or work in places where you may be exposed to hepatitis A. Hepatitis B vaccine  You may need this if you have certain conditions or if you travel or work in places where you may be exposed to hepatitis B. Haemophilus influenzae type b (Hib) vaccine  You may need this if you have certain conditions. Human papillomavirus (HPV) vaccine  If recommended by your health care provider, you may need three doses over 6 months. You may receive vaccines as individual doses or as more than one vaccine together in one shot (combination vaccines). Talk with your health care provider about the risks and benefits of combination vaccines. What tests do  I need? Blood tests  Lipid and cholesterol levels. These may be checked every 5 years, or more frequently if you are over 3 years old.  Hepatitis C test.  Hepatitis B test. Screening  Lung cancer screening. You may have this screening every year starting at age 26 if you have a 30-pack-year history of smoking and currently smoke or have quit within the past 15 years.  Colorectal cancer screening. All adults should have this screening starting at age 48 and continuing until age 83. Your health care provider may recommend screening at age 38 if you are at increased risk. You will have tests every 1-10 years, depending on your results and the type of screening test.  Diabetes screening. This is done by checking your blood sugar (glucose) after you have not eaten for a while (fasting). You may have this done every 1-3 years.  Mammogram. This may be done every 1-2 years.  Talk with your health care provider about when you should start having regular mammograms. This may depend on whether you have a family history of breast cancer.  BRCA-related cancer screening. This may be done if you have a family history of breast, ovarian, tubal, or peritoneal cancers.  Pelvic exam and Pap test. This may be done every 3 years starting at age 83. Starting at age 66, this may be done every 5 years if you have a Pap test in combination with an HPV test. Other tests  Sexually transmitted disease (STD) testing.  Bone density scan. This is done to screen for osteoporosis. You may have this scan if you are at high risk for osteoporosis. Follow these instructions at home: Eating and drinking  Eat a diet that includes fresh fruits and vegetables, whole grains, lean protein, and low-fat dairy.  Take vitamin and mineral supplements as recommended by your health care provider.  Do not drink alcohol if: ? Your health care provider tells you not to drink. ? You are pregnant, may be pregnant, or are planning to become pregnant.  If you drink alcohol: ? Limit how much you have to 0-1 drink a day. ? Be aware of how much alcohol is in your drink. In the [place] one drink equals one 12 oz bottle of beer (355 mL), one 5 oz glass of wine (148 mL), or one 1 oz glass of hard liquor (44 mL). Lifestyle  Take daily care of your teeth and gums.  Stay active. Exercise for at least 30 minutes on 5 or more days each week.  Do not use any products that contain nicotine or tobacco, such as cigarettes, e-cigarettes, and chewing tobacco. If you need help quitting, ask your health care provider.  If you are sexually active, practice safe sex. Use a condom or other form of birth control (contraception) in order to prevent pregnancy and STIs (sexually transmitted infections).  If told by your health care provider, take low-dose aspirin daily starting at age 47. What's next?  Visit your health care  provider once a year for a well check visit.  Ask your health care provider how often you should have your eyes and teeth checked.  Stay up to date on all vaccines. This information is not intended to replace advice given to you by your health care provider. Make sure you discuss any questions you have with your health care provider. Document Revised: 05/24/2018 Document Reviewed: 05/24/2018 Elsevier Patient Education  2020 [employer].

## 2019-09-30 NOTE — Progress Notes (Signed)
Tami Johnson is a 44 y.o. female presents to office today for annual physical exam examination.    Concerns today include: 1.  Cramping of feet Patient reports couple day history of cramping in bilateral feet which have woken her up from sleep.  She feels that she is hydrating adequately.  Denies any diarrhea.  She takes magnesium at baseline for constipation.  2.  Heartburn Patient reports couple month history of indigestion.  She describes this as a burning sensation that goes up into her chest from her stomach.  Denies any associated nausea, vomiting, hematochezia or melena.  No unplanned weight loss.  She took an over-the-counter acid reflux medicine which did help but she discontinued it and the symptoms returned.  3.  Skin lesion Patient reports a skin lesion on the right calf has been present for several months.  She accidentally hit it with a razor and it seemed to get larger in size.  Denies any spontaneous bleeding.  No change in color or texture.  Is not established with a dermatologist.  Marital status: married, Substance use: none Diet: Fair, Exercise: Does not report any structured exercise Last eye exam: Up-to-date Last dental exam: Every 6 months Last colonoscopy: N/A Last mammogram: Up-to-date.  Gets with Dr. Willis Modena Last pap smear: Up-to-date, gets with Dr. Willis Modena.  History of ablation. Refills needed today: Beta-blocker and allergy pill Immunizations needed: Up-to-date  History reviewed. No pertinent past medical history. Social History   Socioeconomic History  . Marital status: Married    Spouse name: Not on file  . Number of children: Not on file  . Years of education: Not on file  . Highest education level: Not on file  Occupational History  . Not on file  Tobacco Use  . Smoking status: Never Smoker  . Smokeless tobacco: Never Used  Substance and Sexual Activity  . Alcohol use: No  . Drug use: No  . Sexual activity: Yes    Birth  control/protection: None  Other Topics Concern  . Not on file  Social History Narrative  . Not on file   Social Determinants of Health   Financial Resource Strain:   . Difficulty of Paying Living Expenses: Not on file  Food Insecurity:   . Worried About Charity fundraiser in the Last Year: Not on file  . Ran Out of Food in the Last Year: Not on file  Transportation Needs:   . Lack of Transportation (Medical): Not on file  . Lack of Transportation (Non-Medical): Not on file  Physical Activity:   . Days of Exercise per Week: Not on file  . Minutes of Exercise per Session: Not on file  Stress:   . Feeling of Stress : Not on file  Social Connections:   . Frequency of Communication with Friends and Family: Not on file  . Frequency of Social Gatherings with Friends and Family: Not on file  . Attends Religious Services: Not on file  . Active Member of Clubs or Organizations: Not on file  . Attends Archivist Meetings: Not on file  . Marital Status: Not on file  Intimate Partner Violence:   . Fear of Current or Ex-Partner: Not on file  . Emotionally Abused: Not on file  . Physically Abused: Not on file  . Sexually Abused: Not on file   Past Surgical History:  Procedure Laterality Date  . FINGER SURGERY  06/10/15   Family History  Problem Relation Age of Onset  .  Hypertension Father   . Heart attack Neg Hx   . Stroke Neg Hx     Current Outpatient Medications:  .  cetirizine (ZYRTEC) 10 MG tablet, Take 1 tablet (10 mg total) by mouth daily., Disp: 90 tablet, Rfl: 1 .  Magnesium 200 MG TABS, Take 1 tablet by mouth daily., Disp: , Rfl:  .  metoprolol tartrate (LOPRESSOR) 25 MG tablet, Take 1 tablet (25 mg total) by mouth 2 (two) times daily. (Needs to be seen before next refill), Disp: 180 tablet, Rfl: 0  Allergies  Allergen Reactions  . Ciprofloxacin Hives  . Sulfa Antibiotics Hives  . Sulfamethoxazole-Trimethoprim Other (See Comments)     ROS: Review of  Systems Constitutional: negative Eyes: positive for contacts/glasses Ears, nose, mouth, throat, and face: negative Respiratory: negative Cardiovascular: positive for PACs Gastrointestinal: positive for constipation and relieved by magnesium Genitourinary:negative Integument/breast: negative Hematologic/lymphatic: positive for skin lesion as above Musculoskeletal:positive for cramping as above Neurological: negative Behavioral/Psych: negative Endocrine: negative Allergic/Immunologic: negative    Physical exam BP 131/88   Pulse 63   Temp 99.1 F (37.3 C) (Temporal)   Ht '5\' 9"'  (1.753 m)   Wt 206 lb (93.4 kg)   SpO2 99%   BMI 30.42 kg/m  General appearance: alert, cooperative, appears stated age and no distress Head: Normocephalic, without obvious abnormality, atraumatic Eyes: negative findings: lids and lashes normal, conjunctivae and sclerae normal, corneas clear and pupils equal, round, reactive to light and accomodation Ears: normal TM's and external ear canals both ears Nose: Nares normal. Septum midline. Mucosa normal. No drainage or sinus tenderness.;  Polyp noted in left nostril Throat: lips, mucosa, and tongue normal; teeth and gums normal; no erosions noted within the oropharynx.  No erythema Neck: no adenopathy, no carotid bruit, no JVD, supple, symmetrical, trachea midline and thyroid not enlarged, symmetric, no tenderness/mass/nodules Back: symmetric, no curvature. ROM normal. No CVA tenderness. Lungs: clear to auscultation bilaterally Heart: regular rate and rhythm, S1, S2 normal, no murmur, click, rub or gallop; occasional extra beat heard Abdomen: soft, non-tender; bowel sounds normal; no masses,  no organomegaly; specifically no epigastric tenderness to palpation. Extremities: extremities normal, atraumatic, no cyanosis or edema Pulses: 2+ and symmetric Skin: 0.5 cm x 0.5 cm papular lesion noted along the posterior lateral right calf.  No appreciable  hypervascularity or central umbilication noted.  Evidence of recent excoriation seen but no active bleeding. Lymph nodes: Cervical, supraclavicular, and axillary nodes normal. Neurologic: Alert and oriented X 3, normal strength and tone. Normal symmetric reflexes. Normal coordination and gait Psych: Mood stable, speech normal, affect appropriate, pleasant and interactive Depression screen [facility] 2/9 09/30/2019 11/13/2018 08/27/2018  Decreased Interest 0 0 0  Down, Depressed, Hopeless 0 0 0  PHQ - 2 Score 0 0 0  Altered sleeping 0 - 0  Tired, decreased energy 0 - 0  Change in appetite 0 - 0  Feeling bad or failure about yourself  0 - 0  Trouble concentrating 0 - 0  Moving slowly or fidgety/restless 0 - 0  Suicidal thoughts 0 - 0  PHQ-9 Score 0 - 0  Difficult doing work/chores - - Not difficult at all   Assessment/ Plan: Rennie Plowman here for annual physical exam.   1. Annual physical exam Sees OB/GYN for Pap smear and mammogram.  She has history of ablation.  2. Obesity (BMI 30.0-34.9) - Vitamin D 25 hydroxy  3. Gastroesophageal reflux disease, unspecified whether esophagitis present Trial of PPI.  Pantoprazole sent.  She  will take daily for 2 weeks and discontinue.  If symptoms return and are not frequent we discussed continuing PPI indefinitely.  She will contact me if this is needed  4. Papule of skin Referral placed to dermatology for evaluation and excision - Ambulatory referral to Dermatology  5. Cramping of feet Uncertain etiology.  She is on magnesium regularly for constipation.  Check iron, vitamin D, CMP.  Encourage hydration.  Handout provided - Ferritin - Vitamin D 25 hydroxy  6. Screening, lipid - Lipid Panel  7. Screening for metabolic disorder - LID03+UDTH  8. Screening, anemia, deficiency, iron - CBC  9. Screening for thyroid disorder - TSH   Handout provided on healthy lifestyle choices, including diet (rich in fruits, vegetables and lean meats and  low in salt and simple carbohydrates) and exercise (at least 30 minutes of moderate physical activity daily).  Patient to follow up in 1 year for annual exam or sooner if needed.  Jadore Mcguffin M. Lajuana Ripple, DO

## 2019-10-01 ENCOUNTER — Other Ambulatory Visit: Payer: Self-pay | Admitting: Family Medicine

## 2019-10-01 DIAGNOSIS — E559 Vitamin D deficiency, unspecified: Secondary | ICD-10-CM | POA: Insufficient documentation

## 2019-10-01 LAB — CMP14+EGFR
ALT: 22 IU/L (ref 0–32)
AST: 16 IU/L (ref 0–40)
Albumin/Globulin Ratio: 2 (ref 1.2–2.2)
Albumin: 4.6 g/dL (ref 3.8–4.8)
Alkaline Phosphatase: 65 IU/L (ref 39–117)
BUN/Creatinine Ratio: 13 (ref 9–23)
BUN: 8 mg/dL (ref 6–24)
Bilirubin Total: 0.3 mg/dL (ref 0.0–1.2)
CO2: 25 mmol/L (ref 20–29)
Calcium: 9 mg/dL (ref 8.7–10.2)
Chloride: 104 mmol/L (ref 96–106)
Creatinine, Ser: 0.61 mg/dL (ref 0.57–1.00)
GFR calc Af Amer: 129 mL/min/{1.73_m2} (ref >59)
GFR calc non Af Amer: 112 mL/min/{1.73_m2} (ref >59)
Globulin, Total: 2.3 g/dL (ref 1.5–4.5)
Glucose: 91 mg/dL (ref 65–99)
Potassium: 4.3 mmol/L (ref 3.5–5.2)
Sodium: 140 mmol/L (ref 134–144)
Total Protein: 6.9 g/dL (ref 6.0–8.5)

## 2019-10-01 LAB — CBC
Hematocrit: 41.8 % (ref 34.0–46.6)
Hemoglobin: 14.2 g/dL (ref 11.1–15.9)
MCH: 30.9 pg (ref 26.6–33.0)
MCHC: 34 g/dL (ref 31.5–35.7)
MCV: 91 fL (ref 79–97)
Platelets: 252 10*3/uL (ref 150–450)
RBC: 4.6 x10E6/uL (ref 3.77–5.28)
RDW: 12.9 % (ref 11.7–15.4)
WBC: 5.5 10*3/uL (ref 3.4–10.8)

## 2019-10-01 LAB — LIPID PANEL
Chol/HDL Ratio: 4.3 ratio (ref 0.0–4.4)
Cholesterol, Total: 222 mg/dL — ABNORMAL HIGH (ref 100–199)
HDL: 52 mg/dL (ref >39)
LDL Chol Calc (NIH): 156 mg/dL — ABNORMAL HIGH (ref 0–99)
Triglycerides: 80 mg/dL (ref 0–149)
VLDL Cholesterol Cal: 14 mg/dL (ref 5–40)

## 2019-10-01 LAB — TSH: TSH: 1.39 u[IU]/mL (ref 0.450–4.500)

## 2019-10-01 LAB — VITAMIN D 25 HYDROXY (VIT D DEFICIENCY, FRACTURES): Vit D, 25-Hydroxy: 15.8 ng/mL — ABNORMAL LOW (ref 30.0–100.0)

## 2019-10-01 LAB — FERRITIN: Ferritin: 126 ng/mL (ref 15–150)

## 2019-10-01 MED ORDER — CHOLECALCIFEROL 1.25 MG (50000 UT) PO CAPS: 50000.0000 [IU] | ORAL_CAPSULE | ORAL | 0 refills | Status: AC

## 2019-10-31 ENCOUNTER — Other Ambulatory Visit: Payer: Self-pay | Admitting: Family Medicine

## 2020-01-16 ENCOUNTER — Encounter: Payer: Self-pay | Admitting: Family Medicine

## 2020-01-16 ENCOUNTER — Ambulatory Visit (INDEPENDENT_AMBULATORY_CARE_PROVIDER_SITE_OTHER): Payer: Managed Care, Other (non HMO) | Admitting: Family Medicine

## 2020-01-16 DIAGNOSIS — J019 Acute sinusitis, unspecified: Secondary | ICD-10-CM

## 2020-01-16 MED ORDER — AMOXICILLIN-POT CLAVULANATE 875-125 MG PO TABS: 1.0000 | ORAL_TABLET | Freq: Two times a day (BID) | ORAL | 0 refills | Status: AC

## 2020-01-16 MED ORDER — PREDNISONE 10 MG (21) PO TBPK: ORAL_TABLET | ORAL | 0 refills | Status: DC

## 2020-01-16 NOTE — Patient Instructions (Signed)

## 2020-01-16 NOTE — Progress Notes (Signed)
   Virtual Visit via Telephone Note  I connected with Tami Johnson on 01/16/20 at 1:10 PM by telephone and verified that I am speaking with the correct person using two identifiers. Tami Johnson is currently located at home and nobody is currently with her during this visit. The provider, Gwenlyn Fudge, FNP is located in their office at time of visit.  I discussed the limitations, risks, security and privacy concerns of performing an evaluation and management service by telephone and the availability of in person appointments. I also discussed with the patient that there may be a patient responsible charge related to this service. The patient expressed understanding and agreed to proceed.  Subjective: PCP: Raliegh Ip, DO  Chief Complaint  Patient presents with  . URI   Patient complains of cough, head congestion, headache, sore throat, ear pain/pressure, facial pain/pressure and white spots on her throat. Onset of symptoms was 4 days ago, gradually worsening since that time. She is drinking plenty of fluids. Evaluation to date: none. Treatment to date: decongestants and a neti-pot. She does not smoke.    ROS: Per HPI  Current Outpatient Medications:  .  cetirizine (ZYRTEC) 10 MG tablet, Take 1 tablet (10 mg total) by mouth daily., Disp: 90 tablet, Rfl: 3 .  Magnesium 200 MG TABS, Take 1 tablet by mouth daily., Disp: , Rfl:  .  metoprolol tartrate (LOPRESSOR) 25 MG tablet, Take 1 tablet (25 mg total) by mouth 2 (two) times daily., Disp: 180 tablet, Rfl: 3 .  pantoprazole (PROTONIX) 40 MG tablet, TAKE ONE TABLET BY MOUTH DAILY, Disp: 30 tablet, Rfl: 5  Allergies  Allergen Reactions  . Ciprofloxacin Hives  . Sulfa Antibiotics Hives  . Sulfamethoxazole-Trimethoprim Other (See Comments)   History reviewed. No pertinent past medical history.  Observations/Objective: A&O  No respiratory distress or wheezing audible over the phone Mood, judgement, and thought  processes all WNL  Assessment and Plan: 1. Acute non-recurrent sinusitis, unspecified location - Education provided on sinusitis. Encouraged to get tested for COVID-19. Discussed symptom management.  - amoxicillin-clavulanate (AUGMENTIN) 875-125 MG tablet; Take 1 tablet by mouth 2 (two) times daily for 7 days.  Dispense: 14 tablet; Refill: 0 - predniSONE (STERAPRED UNI-PAK 21 TAB) 10 MG (21) TBPK tablet; As directed x 6 days  Dispense: 21 tablet; Refill: 0   Follow Up Instructions:  I discussed the assessment and treatment plan with the patient. The patient was provided an opportunity to ask questions and all were answered. The patient agreed with the plan and demonstrated an understanding of the instructions.   The patient was advised to call back or seek an in-person evaluation if the symptoms worsen or if the condition fails to improve as anticipated.  The above assessment and management plan was discussed with the patient. The patient verbalized understanding of and has agreed to the management plan. Patient is aware to call the clinic if symptoms persist or worsen. Patient is aware when to return to the clinic for a follow-up visit. Patient educated on when it is appropriate to go to the emergency department.   Time call ended: 1:20 PM  I provided 12 minutes of non-face-to-face time during this encounter.  Deliah Boston, MSN, APRN, FNP-C [facility] 01/16/20

## 2020-03-10 DIAGNOSIS — J329 Chronic sinusitis, unspecified: Secondary | ICD-10-CM | POA: Insufficient documentation

## 2020-05-04 ENCOUNTER — Other Ambulatory Visit: Payer: Self-pay | Admitting: Family Medicine

## 2020-05-04 NOTE — Telephone Encounter (Signed)
OV 09/30/19 rtc 1 yr

## 2020-06-29 ENCOUNTER — Other Ambulatory Visit: Payer: Self-pay

## 2020-06-29 ENCOUNTER — Other Ambulatory Visit: Payer: Managed Care, Other (non HMO)

## 2020-06-29 DIAGNOSIS — Z20822 Contact with and (suspected) exposure to covid-19: Secondary | ICD-10-CM

## 2020-06-30 LAB — NOVEL CORONAVIRUS, NAA: SARS-CoV-2, NAA: NOT DETECTED

## 2020-06-30 LAB — SARS-COV-2, NAA 2 DAY TAT

## 2020-07-14 ENCOUNTER — Telehealth: Payer: Self-pay

## 2020-07-14 ENCOUNTER — Ambulatory Visit: Payer: Managed Care, Other (non HMO) | Admitting: Family Medicine

## 2020-07-14 ENCOUNTER — Other Ambulatory Visit: Payer: Self-pay

## 2020-07-14 DIAGNOSIS — I10 Essential (primary) hypertension: Secondary | ICD-10-CM

## 2020-07-14 NOTE — Telephone Encounter (Signed)
Appointment scheduled.

## 2020-07-14 NOTE — Telephone Encounter (Signed)
I suspect this is situational anxiety contributing, as she was normotensive here and normotensive at home.  Have her come in for a spot check of BP with nurse

## 2020-07-14 NOTE — Telephone Encounter (Signed)
When to chiropractor yesterday to get her neck popped and bp 160/155 When she got home yesterday her bp was 140/95 Today she went back to chiropractor and bp 160/110 When she got home it was 120/87  States that Dr. Magnus Sinning was supposed to contact Dr. Reece Agar   Patient states she feels fine except she has a headache but that is normal for after she gets her neck popped.   Please advise

## 2020-07-14 NOTE — Progress Notes (Signed)
Spoke to [place] we do not do these anymore they do not have the software to do them.

## 2020-07-14 NOTE — Progress Notes (Signed)
Would ambulatory BP monitoring done for patient.

## 2020-07-14 NOTE — Progress Notes (Signed)
Appt made

## 2020-07-14 NOTE — Progress Notes (Signed)
Patient seen nurse today for BP check.   BP-157/93 P-61  Second check   BP-149//94 P-54  Patient states she will take her BO once she gets home and see what it is there as well. Please advise

## 2020-07-21 ENCOUNTER — Other Ambulatory Visit: Payer: Self-pay | Admitting: Family Medicine

## 2020-07-21 ENCOUNTER — Other Ambulatory Visit: Payer: Self-pay

## 2020-07-21 ENCOUNTER — Ambulatory Visit: Payer: Managed Care, Other (non HMO) | Admitting: *Deleted

## 2020-07-21 DIAGNOSIS — I10 Essential (primary) hypertension: Secondary | ICD-10-CM

## 2020-07-21 MED ORDER — HYDROCHLOROTHIAZIDE 25 MG PO TABS: 25.0000 mg | ORAL_TABLET | Freq: Every day | ORAL | 0 refills | Status: DC

## 2020-07-21 NOTE — Progress Notes (Signed)
Pt BP today was 148/93. She brought a weeks of BP readings. Dr. Reece Agar added HCTZ. Pt aware. Nurse f/u in 2 weeks and Dr Reece Agar f/u in 1 month

## 2020-07-22 ENCOUNTER — Telehealth: Payer: Self-pay

## 2020-07-22 NOTE — Telephone Encounter (Signed)
Patient aware and verbalizes understanding. 

## 2020-07-22 NOTE — Telephone Encounter (Signed)
Pt called requesting to speak with Almira Coaster, who she saw yesterday for BP check. Says she took her BP pill today and has felt fine all day until now. Says her heart is now pounding and she is very worried.  Please call pt ASAP

## 2020-07-22 NOTE — Telephone Encounter (Signed)
If her blood pressure dropped and then her pulse went up, it may be from the HCTZ Otherwise, continue current regimen and monitor

## 2020-07-22 NOTE — Telephone Encounter (Signed)
Patient took Hctz for first time this morning.  She is concerned because about 11:30 this morning her heart started feeling like it was racing.  She said it last until about 2:00 pm.  Pulse rate according to Apple watch was between 98-103.  Patient is worried it could be coming from the Hctz and would like to know what you think she should do.

## 2020-08-06 ENCOUNTER — Ambulatory Visit: Payer: Managed Care, Other (non HMO) | Admitting: *Deleted

## 2020-08-06 ENCOUNTER — Other Ambulatory Visit: Payer: Self-pay

## 2020-08-06 DIAGNOSIS — I10 Essential (primary) hypertension: Secondary | ICD-10-CM

## 2020-08-06 NOTE — Progress Notes (Signed)
Pt brought list of recent BPs in, Todays BP was 134 74. Continue same meds per Dr. Nadine Counts and keep appt on 08/24/20, will get BMP that day

## 2020-08-10 ENCOUNTER — Other Ambulatory Visit: Payer: Self-pay | Admitting: Obstetrics and Gynecology

## 2020-08-10 DIAGNOSIS — Z1231 Encounter for screening mammogram for malignant neoplasm of breast: Secondary | ICD-10-CM

## 2020-08-24 ENCOUNTER — Ambulatory Visit: Payer: Managed Care, Other (non HMO) | Admitting: Family Medicine

## 2020-08-24 ENCOUNTER — Other Ambulatory Visit: Payer: Self-pay

## 2020-08-24 ENCOUNTER — Encounter: Payer: Self-pay | Admitting: Family Medicine

## 2020-08-24 VITALS — BP 139/94 | HR 78 | Temp 97.8°F | Ht 69.0 in | Wt 207.0 lb

## 2020-08-24 DIAGNOSIS — I10 Essential (primary) hypertension: Secondary | ICD-10-CM | POA: Diagnosis not present

## 2020-08-24 DIAGNOSIS — J392 Other diseases of pharynx: Secondary | ICD-10-CM

## 2020-08-24 MED ORDER — AMOXICILLIN-POT CLAVULANATE 875-125 MG PO TABS: 1.0000 | ORAL_TABLET | Freq: Two times a day (BID) | ORAL | 0 refills | Status: DC

## 2020-08-24 MED ORDER — METOPROLOL SUCCINATE ER 25 MG PO TB24: 12.5000 mg | ORAL_TABLET | Freq: Every day | ORAL | 0 refills | Status: DC

## 2020-08-24 NOTE — Patient Instructions (Signed)
Humidification for dryness. If symptoms worsen, use antibiotic

## 2020-08-24 NOTE — Progress Notes (Signed)
Subjective: CC: HTN PCP: Raliegh Ip, DO VPX:TGGYIRS L Loughmiller is a 44 y.o. female presenting to clinic today for:  1. HTN/ sinusitis Patient was seen on 08/06/2020.  Her blood pressure at that time was 134/74.  Her blood pressures at home have been ranging anywhere from 120 systolic to 150s systolic over 70s to 90s diastolic.  She reports some cramping in the hands since starting the hydrochlorothiazide but otherwise seems to be tolerating the medication without difficulty.  No reports of dizziness.  She is had a mild headache but thinks that this may be related to her sinuses.  She is taking Zyrtec and using a nasal spray.  She recently moved into a new home and does note that the air is dry there.  She also notes that she has had some oropharyngeal irritation each morning and that her mucus is thick.  Not currently using any humidification.  Denies any fevers, purulence from the nares.   ROS: Per HPI  Allergies  Allergen Reactions  . Ciprofloxacin Hives  . Sulfa Antibiotics Hives  . Sulfamethoxazole-Trimethoprim Other (See Comments)   No past medical history on file.  Current Outpatient Medications:  .  cetirizine (ZYRTEC) 10 MG tablet, Take 1 tablet (10 mg total) by mouth daily., Disp: 90 tablet, Rfl: 3 .  hydrochlorothiazide (HYDRODIURIL) 25 MG tablet, Take 1 tablet (25 mg total) by mouth daily., Disp: 90 tablet, Rfl: 0 .  Magnesium 200 MG TABS, Take 1 tablet by mouth daily., Disp: , Rfl:  .  metoprolol tartrate (LOPRESSOR) 25 MG tablet, Take 1 tablet (25 mg total) by mouth 2 (two) times daily., Disp: 180 tablet, Rfl: 3 .  pantoprazole (PROTONIX) 40 MG tablet, TAKE ONE TABLET BY MOUTH DAILY, Disp: 90 tablet, Rfl: 1 Social History   Socioeconomic History  . Marital status: Married    Spouse name: Not on file  . Number of children: Not on file  . Years of education: Not on file  . Highest education level: Not on file  Occupational History  . Not on file  Tobacco Use   . Smoking status: Never Smoker  . Smokeless tobacco: Never Used  Vaping Use  . Vaping Use: Never used  Substance and Sexual Activity  . Alcohol use: No  . Drug use: No  . Sexual activity: Yes    Birth control/protection: None  Other Topics Concern  . Not on file  Social History Narrative  . Not on file   Social Determinants of Health   Financial Resource Strain:   . Difficulty of Paying Living Expenses: Not on file  Food Insecurity:   . Worried About Programme researcher, broadcasting/film/video in the Last Year: Not on file  . Ran Out of Food in the Last Year: Not on file  Transportation Needs:   . Lack of Transportation (Medical): Not on file  . Lack of Transportation (Non-Medical): Not on file  Physical Activity:   . Days of Exercise per Week: Not on file  . Minutes of Exercise per Session: Not on file  Stress:   . Feeling of Stress : Not on file  Social Connections:   . Frequency of Communication with Friends and Family: Not on file  . Frequency of Social Gatherings with Friends and Family: Not on file  . Attends Religious Services: Not on file  . Active Member of Clubs or Organizations: Not on file  . Attends Banker Meetings: Not on file  . Marital Status: Not on  file  Intimate Partner Violence:   . Fear of Current or Ex-Partner: Not on file  . Emotionally Abused: Not on file  . Physically Abused: Not on file  . Sexually Abused: Not on file   Family History  Problem Relation Age of Onset  . Hypertension Father   . Heart attack Neg Hx   . Stroke Neg Hx     Objective: Office vital signs reviewed. BP (!) 139/94   Pulse 78   Temp 97.8 F (36.6 C) (Temporal)   Ht 5\' 9"  (1.753 m)   Wt 207 lb (93.9 kg)   SpO2 99%   BMI 30.57 kg/m   Physical Examination:  General: Awake, alert, well nourished, No acute distress HEENT: Normal; sclera white.  Moist mucous membranes.  No oropharyngeal masses, tonsillar enlargement or exudates.  Minimal cobblestone appearance of the  oropharynx. Cardio: regular rate and rhythm, S1S2 heard, no murmurs appreciated Pulm: clear to auscultation bilaterally, no wheezes, rhonchi or rales; normal work of breathing on room air  Assessment/ Plan: 44 y.o. female   Essential hypertension - Plan: metoprolol succinate (TOPROL-XL) 25 MG 24 hr tablet, Basic Metabolic Panel  Dry throat  Blood pressure is not at goal.  Most of her blood pressures at home are either in the 140s systolic or in the 90s diastolic.  Therefore I am going to place her back on her beta-blocker make it more convenient for her by giving her the extended release metoprolol.  She will take 12.5 milligrams daily.  Continue hydrochlorothiazide.  BMP ordered to check electrolytes given reports of cramping in the hands.  I would like her to send me in her blood pressure readings in the next 2 to 3 weeks.  If she is at goal no further adjustment needed.  If not at goal, we can consider additional medications.  Would likely go to an ACE- I or ARB to combine with her hydrochlorothiazide next.  We discussed backing down on her salt intake.  With regards to her dry throat and reports of oropharyngeal irritation each morning, I suspect this is more mediated by dry air and her drying pharmacologic agents.  I have recommended that she use humidification.  I have also given her a pocket prescription for an antibiotic to use if symptoms warrant.  She understands reasons to start the medication.  Orders Placed This Encounter  Procedures  . Basic Metabolic Panel   Meds ordered this encounter  Medications  . metoprolol succinate (TOPROL-XL) 25 MG 24 hr tablet    Sig: Take 0.5 tablets (12.5 mg total) by mouth daily.    Dispense:  15 tablet    Refill:  0  . amoxicillin-clavulanate (AUGMENTIN) 875-125 MG tablet    Sig: Take 1 tablet by mouth 2 (two) times daily.    Dispense:  20 tablet    Refill:  0     Louetta Hollingshead 44, DO [facility] [phone]

## 2020-08-25 LAB — BASIC METABOLIC PANEL
BUN/Creatinine Ratio: 19 (ref 9–23)
BUN: 13 mg/dL (ref 6–24)
CO2: 24 mmol/L (ref 20–29)
Calcium: 9.3 mg/dL (ref 8.7–10.2)
Chloride: 101 mmol/L (ref 96–106)
Creatinine, Ser: 0.68 mg/dL (ref 0.57–1.00)
GFR calc Af Amer: 124 mL/min/{1.73_m2} (ref >59)
GFR calc non Af Amer: 107 mL/min/{1.73_m2} (ref >59)
Glucose: 98 mg/dL (ref 65–99)
Potassium: 3.9 mmol/L (ref 3.5–5.2)
Sodium: 139 mmol/L (ref 134–144)

## 2020-09-11 ENCOUNTER — Encounter: Payer: Self-pay | Admitting: Family Medicine

## 2020-09-11 ENCOUNTER — Other Ambulatory Visit: Payer: Self-pay | Admitting: Family Medicine

## 2020-09-11 DIAGNOSIS — I10 Essential (primary) hypertension: Secondary | ICD-10-CM

## 2020-09-11 MED ORDER — HYDROCHLOROTHIAZIDE 25 MG PO TABS: 25.0000 mg | ORAL_TABLET | Freq: Every day | ORAL | 3 refills | Status: DC

## 2020-09-11 MED ORDER — METOPROLOL SUCCINATE ER 25 MG PO TB24: 12.5000 mg | ORAL_TABLET | Freq: Every day | ORAL | 3 refills | Status: DC

## 2020-09-15 ENCOUNTER — Ambulatory Visit
Admission: RE | Admit: 2020-09-15 | Discharge: 2020-09-15 | Disposition: A | Discharge status: Home / Self Care | Payer: Managed Care, Other (non HMO) | Source: Ambulatory Visit | Attending: Obstetrics and Gynecology | Admitting: Obstetrics and Gynecology

## 2020-09-15 ENCOUNTER — Other Ambulatory Visit: Payer: Self-pay

## 2020-09-15 ENCOUNTER — Other Ambulatory Visit: Payer: Self-pay | Admitting: Family Medicine

## 2020-09-15 DIAGNOSIS — Z1231 Encounter for screening mammogram for malignant neoplasm of breast: Secondary | ICD-10-CM

## 2020-09-15 DIAGNOSIS — I10 Essential (primary) hypertension: Secondary | ICD-10-CM

## 2020-09-21 ENCOUNTER — Ambulatory Visit: Payer: Managed Care, Other (non HMO)

## 2020-11-04 ENCOUNTER — Other Ambulatory Visit: Payer: Self-pay | Admitting: Family Medicine

## 2020-11-06 ENCOUNTER — Other Ambulatory Visit: Payer: Self-pay | Admitting: Family Medicine

## 2020-12-24 ENCOUNTER — Encounter: Payer: Managed Care, Other (non HMO) | Admitting: Family Medicine

## 2020-12-28 ENCOUNTER — Encounter: Payer: Managed Care, Other (non HMO) | Admitting: Family Medicine

## 2021-01-04 ENCOUNTER — Encounter: Payer: Managed Care, Other (non HMO) | Admitting: Family Medicine

## 2021-01-19 ENCOUNTER — Telehealth: Payer: Self-pay

## 2021-01-19 ENCOUNTER — Other Ambulatory Visit: Payer: Self-pay | Admitting: Family Medicine

## 2021-01-19 DIAGNOSIS — I491 Atrial premature depolarization: Secondary | ICD-10-CM

## 2021-01-19 DIAGNOSIS — E559 Vitamin D deficiency, unspecified: Secondary | ICD-10-CM

## 2021-01-19 DIAGNOSIS — I10 Essential (primary) hypertension: Secondary | ICD-10-CM

## 2021-01-19 NOTE — Progress Notes (Signed)
v

## 2021-01-19 NOTE — Telephone Encounter (Signed)
Pt has appt on 6/17 for CPE and is planning on coming in to do her lab work on 6/10. Please add lab order for 6/17 visit.

## 2021-01-19 NOTE — Telephone Encounter (Signed)
Lab orders placed, patient informed.

## 2021-01-22 ENCOUNTER — Encounter: Payer: Managed Care, Other (non HMO) | Admitting: Family Medicine

## 2021-03-05 ENCOUNTER — Other Ambulatory Visit: Payer: Self-pay

## 2021-03-05 ENCOUNTER — Other Ambulatory Visit: Payer: Managed Care, Other (non HMO)

## 2021-03-05 DIAGNOSIS — E559 Vitamin D deficiency, unspecified: Secondary | ICD-10-CM

## 2021-03-05 DIAGNOSIS — I10 Essential (primary) hypertension: Secondary | ICD-10-CM

## 2021-03-05 DIAGNOSIS — I491 Atrial premature depolarization: Secondary | ICD-10-CM

## 2021-03-06 LAB — CBC WITH DIFFERENTIAL/PLATELET
Basophils Absolute: 0.1 10*3/uL (ref 0.0–0.2)
Basos: 1 %
EOS (ABSOLUTE): 0.3 10*3/uL (ref 0.0–0.4)
Eos: 4 %
Hematocrit: 41.3 % (ref 34.0–46.6)
Hemoglobin: 13.2 g/dL (ref 11.1–15.9)
Immature Grans (Abs): 0 10*3/uL (ref 0.0–0.1)
Immature Granulocytes: 0 %
Lymphocytes Absolute: 2.9 10*3/uL (ref 0.7–3.1)
Lymphs: 39 %
MCH: 27.7 pg (ref 26.6–33.0)
MCHC: 32 g/dL (ref 31.5–35.7)
MCV: 87 fL (ref 79–97)
Monocytes Absolute: 0.6 10*3/uL (ref 0.1–0.9)
Monocytes: 8 %
Neutrophils Absolute: 3.6 10*3/uL (ref 1.4–7.0)
Neutrophils: 48 %
Platelets: 313 10*3/uL (ref 150–450)
RBC: 4.77 x10E6/uL (ref 3.77–5.28)
RDW: 13.4 % (ref 11.7–15.4)
WBC: 7.4 10*3/uL (ref 3.4–10.8)

## 2021-03-06 LAB — CMP14+EGFR
ALT: 18 IU/L (ref 0–32)
AST: 15 IU/L (ref 0–40)
Albumin/Globulin Ratio: 2 (ref 1.2–2.2)
Albumin: 4.3 g/dL (ref 3.8–4.8)
Alkaline Phosphatase: 78 IU/L (ref 44–121)
BUN/Creatinine Ratio: 16 (ref 9–23)
BUN: 11 mg/dL (ref 6–24)
Bilirubin Total: <0.2 mg/dL (ref 0.0–1.2)
CO2: 21 mmol/L (ref 20–29)
Calcium: 9.5 mg/dL (ref 8.7–10.2)
Chloride: 104 mmol/L (ref 96–106)
Creatinine, Ser: 0.69 mg/dL (ref 0.57–1.00)
Globulin, Total: 2.2 g/dL (ref 1.5–4.5)
Glucose: 98 mg/dL (ref 65–99)
Potassium: 4 mmol/L (ref 3.5–5.2)
Sodium: 140 mmol/L (ref 134–144)
Total Protein: 6.5 g/dL (ref 6.0–8.5)
eGFR: 110 mL/min/{1.73_m2} (ref >59)

## 2021-03-06 LAB — LIPID PANEL
Chol/HDL Ratio: 3.9 ratio (ref 0.0–4.4)
Cholesterol, Total: 202 mg/dL — ABNORMAL HIGH (ref 100–199)
HDL: 52 mg/dL (ref >39)
LDL Chol Calc (NIH): 137 mg/dL — ABNORMAL HIGH (ref 0–99)
Triglycerides: 72 mg/dL (ref 0–149)
VLDL Cholesterol Cal: 13 mg/dL (ref 5–40)

## 2021-03-06 LAB — TSH: TSH: 1.6 u[IU]/mL (ref 0.450–4.500)

## 2021-03-06 LAB — VITAMIN D 25 HYDROXY (VIT D DEFICIENCY, FRACTURES): Vit D, 25-Hydroxy: 28.7 ng/mL — ABNORMAL LOW (ref 30.0–100.0)

## 2021-03-10 ENCOUNTER — Telehealth: Payer: Managed Care, Other (non HMO) | Admitting: Family Medicine

## 2021-03-10 NOTE — Telephone Encounter (Signed)
Patient aware and verbalized understanding. °

## 2021-03-12 ENCOUNTER — Encounter: Payer: Managed Care, Other (non HMO) | Admitting: Family Medicine

## 2021-04-13 ENCOUNTER — Other Ambulatory Visit: Payer: Self-pay

## 2021-04-13 ENCOUNTER — Ambulatory Visit (INDEPENDENT_AMBULATORY_CARE_PROVIDER_SITE_OTHER): Payer: Managed Care, Other (non HMO) | Admitting: Family Medicine

## 2021-04-13 ENCOUNTER — Encounter: Payer: Self-pay | Admitting: Family Medicine

## 2021-04-13 VITALS — BP 129/87 | HR 78 | Temp 97.8°F | Ht 69.0 in | Wt 203.4 lb

## 2021-04-13 DIAGNOSIS — K219 Gastro-esophageal reflux disease without esophagitis: Secondary | ICD-10-CM

## 2021-04-13 DIAGNOSIS — I10 Essential (primary) hypertension: Secondary | ICD-10-CM | POA: Diagnosis not present

## 2021-04-13 DIAGNOSIS — E7800 Pure hypercholesterolemia, unspecified: Secondary | ICD-10-CM | POA: Diagnosis not present

## 2021-04-13 DIAGNOSIS — Z0001 Encounter for general adult medical examination with abnormal findings: Secondary | ICD-10-CM | POA: Diagnosis not present

## 2021-04-13 DIAGNOSIS — Z0000 Encounter for general adult medical examination without abnormal findings: Secondary | ICD-10-CM

## 2021-04-13 DIAGNOSIS — J301 Allergic rhinitis due to pollen: Secondary | ICD-10-CM | POA: Diagnosis not present

## 2021-04-13 DIAGNOSIS — I491 Atrial premature depolarization: Secondary | ICD-10-CM | POA: Diagnosis not present

## 2021-04-13 MED ORDER — PANTOPRAZOLE SODIUM 40 MG PO TBEC: 40.0000 mg | DELAYED_RELEASE_TABLET | Freq: Every day | ORAL | 3 refills | Status: DC

## 2021-04-13 MED ORDER — HYDROCHLOROTHIAZIDE 25 MG PO TABS: 25.0000 mg | ORAL_TABLET | Freq: Every day | ORAL | 3 refills | Status: DC

## 2021-04-13 MED ORDER — MOMETASONE FUROATE 50 MCG/ACT NA SUSP: 2.0000 | Freq: Every day | NASAL | 4 refills | Status: DC

## 2021-04-13 MED ORDER — METOPROLOL SUCCINATE ER 25 MG PO TB24: 12.5000 mg | ORAL_TABLET | Freq: Every day | ORAL | 3 refills | Status: DC

## 2021-04-13 MED ORDER — LEVOCETIRIZINE DIHYDROCHLORIDE 5 MG PO TABS: 5.0000 mg | ORAL_TABLET | Freq: Every evening | ORAL | 3 refills | Status: DC

## 2021-04-13 NOTE — Patient Instructions (Signed)
We are switching her Zyrtec to Xyzal at bedtime and I have also added Nasonex.  Please make sure that you review how to use the nasal spray appropriately for maximal response.  If you have had no significant improvement in your allergy symptoms within the next 3 to 4 weeks, contact me we can consider adding something called Singulair at bedtime.  If you end up having breakthrough acid reflux and/or anticipate eating heavy or smoked meal, go ahead and take an extra Protonix in the evening time to prevent breakthrough acid reflux.  Food Choices for Gastroesophageal Reflux Disease, Adult When you have gastroesophageal reflux disease (GERD), the foods you eat and your eating habits are very important. Choosing the right foods can help ease your discomfort. Think about working with a food expert (dietitian) to help you make good choices. What are tips for following this plan? Reading food labels Look for foods that are low in saturated fat. Foods that may help with your symptoms include: Foods that have less than 5% of daily value (DV) of fat. Foods that have 0 grams of trans fat. Cooking Do not fry your food. Cook your food by baking, steaming, grilling, or broiling. These are all methods that do not need a lot of fat for cooking. To add flavor, try to use herbs that are low in spice and acidity. Meal planning  Choose healthy foods that are low in fat, such as: Fruits and vegetables. Whole grains. Low-fat dairy products. Lean meats, fish, and poultry. Eat small meals often instead of eating 3 large meals each day. Eat your meals slowly in a place where you are relaxed. Avoid bending over or lying down until 2-3 hours after eating. Limit high-fat foods such as fatty meats or fried foods. Limit your intake of fatty foods, such as oils, butter, and shortening. Avoid the following as told by your doctor: Foods that cause symptoms. These may be different for different people. Keep a food diary to  keep track of foods that cause symptoms. Alcohol. Drinking a lot of liquid with meals. Eating meals during the 2-3 hours before bed.  Lifestyle Stay at a healthy weight. Ask your doctor what weight is healthy for you. If you need to lose weight, work with your doctor to do so safely. Exercise for at least 30 minutes on 5 or more days each week, or as told by your doctor. Wear loose-fitting clothes. Do not smoke or use any products that contain nicotine or tobacco. If you need help quitting, ask your doctor. Sleep with the head of your bed higher than your feet. Use a wedge under the mattress or blocks under the bed frame to raise the head of the bed. Chew sugar-free gum after meals. What foods should eat?  Eat a healthy, well-balanced diet of fruits, vegetables, whole grains, low-fatdairy products, lean meats, fish, and poultry. Each person is different. Foods that may cause symptoms in one person may not cause any symptoms inanother person. Work with your doctor to find foods that are safe for you. The items listed above may not be a complete list of what you can eat and drink. Contact a food expert for more options. What foods should I avoid? Limiting some of these foods may help in managing the symptoms of GERD. Everyone is different. Talk with a food expert or your doctor to help you findthe exact foods to avoid, if any. Fruits Any fruits prepared with added fat. Any fruits that cause symptoms. For some  people, this may include citrus fruits, such as oranges, grapefruit, pineapple,and lemons. Vegetables Deep-fried vegetables. [place] fries. Any vegetables prepared with added fat. Any vegetables that cause symptoms. For some people, this may include tomatoesand tomato products, chili peppers, onions and garlic, and horseradish. Grains Pastries or quick breads with added fat. Meats and other proteins High-fat meats, such as fatty beef or pork, hot dogs, ribs, ham, sausage, salami, and  bacon. Fried meat or protein, including fried fish and friedchicken. Nuts and nut butters, in large amounts. Dairy Whole milk and chocolate milk. Sour cream. Cream. Ice cream. Cream cheese.Milkshakes. Fats and oils Butter. Margarine. Shortening. Ghee. Beverages Coffee and tea, with or without caffeine. Carbonated beverages. Sodas. Energy drinks. Fruit juice made with acidic fruits, such as orange or grapefruit.Tomato juice. Alcoholic drinks. Sweets and desserts Chocolate and cocoa. Donuts. Seasonings and condiments Pepper. Peppermint and spearmint. Added salt. Any condiments, herbs, or seasonings that cause symptoms. For some people, this may include curry, hotsauce, or vinegar-based salad dressings. The items listed above may not be a complete list of what you should not eat and drink. Contact a food expert for more options. Questions to ask your doctor Diet and lifestyle changes are often the first steps that are taken to manage symptoms of GERD. If diet and lifestyle changes do not help, talk with yourdoctor about taking medicines. Where to find more information International Foundation for Gastrointestinal Disorders: aboutgerd.org Summary When you have GERD, food and lifestyle choices are very important in easing your symptoms. Eat small meals often instead of 3 large meals a day. Eat your meals slowly and in a place where you are relaxed. Avoid bending over or lying down until 2-3 hours after eating. Limit high-fat foods such as fatty meats or fried foods. This information is not intended to replace advice given to you by your health care provider. Make sure you discuss any questions you have with your healthcare provider. Document Revised: 03/23/2020 Document Reviewed: 03/23/2020 Elsevier Patient Education  2022 [employer].

## 2021-04-13 NOTE — Progress Notes (Signed)
Tami Johnson is a 45 y.o. female presents to office today for annual physical exam examination.    Concerns today include: 1. HTN w/ HLD Compliant with her antihypertensives.  Not currently treated with cholesterol medication for ASCVD risk of 1.2%.  No chest pain, shortness of breath or change in vision  2.  Allergic rhinitis Patient reports that she has ongoing breakthrough allergy symptoms.  She did move homes recently and wonders if this is impacting.  Currently treated with Zyrtec which previously has worked for her but this year is no longer effective.  Not using any nasal sprays.  Has been treated for bacterial sinusitis earlier this year.  Reports nasal congestion that is most prominent at nighttime and she wakes up with phlegm in her throat.  3. GERD Compliant with her PPI.  She occasionally does have breakthrough symptoms that occur at nighttime and this seems to be associated with certain foods.  She cites an event where she consumed barbecue and symptoms woke her from sleep.  She had a bomb in efforts to relieve.   Marital status: married, Substance use: none Diet: balanced, Exercise: active Last eye exam: UTD Last dental exam: UTD Last colonoscopy: n/a Last mammogram: UTD Last pap smear: UTD, sees Dr. Jackelyn Knife Refills needed today: All Immunizations needed: Immunization History  Administered Date(s) Administered   Influenza Split 07/11/2013   Influenza,inj,Quad PF,6+ Mos 07/13/2015, 07/07/2016   Influenza-Unspecified 07/14/2014, 07/12/2016, 07/24/2019   Moderna Sars-Covid-2 Vaccination 02/17/2020, 03/17/2020   Td 12/10/2014     No past medical history on file. Social History   Socioeconomic History   Marital status: Married    Spouse name: Not on file   Number of children: Not on file   Years of education: Not on file   Highest education level: Not on file  Occupational History   Not on file  Tobacco Use   Smoking status: Never   Smokeless tobacco:  Never  Vaping Use   Vaping Use: Never used  Substance and Sexual Activity   Alcohol use: No   Drug use: No   Sexual activity: Yes    Birth control/protection: None  Other Topics Concern   Not on file  Social History Narrative   Not on file   Social Determinants of Health   Financial Resource Strain: Not on file  Food Insecurity: Not on file  Transportation Needs: Not on file  Physical Activity: Not on file  Stress: Not on file  Social Connections: Not on file  Intimate Partner Violence: Not on file   Past Surgical History:  Procedure Laterality Date   FINGER SURGERY  06/10/15   Family History  Problem Relation Age of Onset   Hypertension Father    Heart attack Neg Hx    Stroke Neg Hx     Current Outpatient Medications:    cetirizine (ZYRTEC) 10 MG tablet, TAKE ONE TABLET BY MOUTH DAILY, Disp: 90 tablet, Rfl: 1   hydrochlorothiazide (HYDRODIURIL) 25 MG tablet, Take 1 tablet (25 mg total) by mouth daily., Disp: 90 tablet, Rfl: 3   Magnesium 200 MG TABS, Take 1 tablet by mouth daily., Disp: , Rfl:    metoprolol succinate (TOPROL-XL) 25 MG 24 hr tablet, Take 0.5 tablets (12.5 mg total) by mouth daily., Disp: 45 tablet, Rfl: 3   pantoprazole (PROTONIX) 40 MG tablet, TAKE ONE TABLET BY MOUTH DAILY, Disp: 90 tablet, Rfl: 1  Allergies  Allergen Reactions   Ciprofloxacin Hives   Sulfa Antibiotics Hives  Sulfamethoxazole-Trimethoprim Other (See Comments)     ROS: Review of Systems Pertinent items noted in HPI and remainder of comprehensive ROS otherwise negative.    Physical exam BP 129/87   Pulse 78   Temp 97.8 F (36.6 C)   Ht 5\' 9"  (1.753 m)   Wt 203 lb 6.4 oz (92.3 kg)   SpO2 96%   BMI 30.04 kg/m  General appearance: alert, cooperative, appears stated age, and no distress Head: Normocephalic, without obvious abnormality, atraumatic Eyes: negative findings: lids and lashes normal, conjunctivae and sclerae normal, corneas clear, and pupils equal, round,  reactive to light and accomodation Ears: normal TM's and external ear canals both ears Nose: Nares normal. Septum midline. Mucosa normal. No drainage or sinus tenderness. Throat: lips, mucosa, and tongue normal; teeth and gums normal Neck: no adenopathy, supple, symmetrical, trachea midline, and thyroid not enlarged, symmetric, no tenderness/mass/nodules Back: symmetric, no curvature. ROM normal. No CVA tenderness. Lungs: clear to auscultation bilaterally Heart: regular rate and rhythm, S1, S2 normal, no murmur, click, rub or gallop Abdomen: soft, non-tender; bowel sounds normal; no masses,  no organomegaly Extremities: extremities normal, atraumatic, no cyanosis or edema Pulses: 2+ and symmetric Skin: Skin color, texture, turgor normal. No rashes or lesions Lymph nodes: Cervical, supraclavicular, and axillary nodes normal. Neurologic: Alert and oriented X 3, normal strength and tone. Normal symmetric reflexes. Normal coordination and gait  Psych: mood stable Depression screen [facility] 2/9 04/13/2021 08/24/2020 09/30/2019  Decreased Interest 0 0 0  Down, Depressed, Hopeless 0 - 0  PHQ - 2 Score 0 0 0  Altered sleeping - 0 0  Tired, decreased energy - 0 0  Change in appetite - 0 0  Feeling bad or failure about yourself  - 0 0  Trouble concentrating - 0 0  Moving slowly or fidgety/restless - 0 0  Suicidal thoughts - - 0  PHQ-9 Score - 0 0  Difficult doing work/chores - - -   GAD 7 : Generalized Anxiety Score 04/13/2021  Nervous, Anxious, on Edge 0  Control/stop worrying 0  Worry too much - different things 0  Trouble relaxing 0  Restless 0  Easily annoyed or irritable 0  Afraid - awful might happen 0  Total GAD 7 Score 0   Assessment/ Plan: 04/15/2021 here for annual physical exam.   Annual physical exam  Screening for malignant neoplasm of cervix  Pure hypercholesterolemia  Primary hypertension - Plan: hydrochlorothiazide (HYDRODIURIL) 25 MG tablet, metoprolol succinate  (TOPROL-XL) 25 MG 24 hr tablet  PAC (premature atrial contraction)  Gastroesophageal reflux disease without esophagitis - Plan: pantoprazole (PROTONIX) 40 MG tablet  Seasonal allergic rhinitis due to pollen - Plan: mometasone (NASONEX) 50 MCG/ACT nasal spray, levocetirizine (XYZAL) 5 MG tablet  Release of information form completed to get Pap smear from Dr. [name] office.  Blood pressure under excellent control.  No changes made.  Continue current regimen.  Not treated for hypercholesterolemia due to low ASCVD risk score.  Family history is negative for CAD  GERD is not well controlled all the time and therefore I have advised her to use an extra Protonix in the evening time when she anticipates eating heavier smoked foods.  If this is required multiple times per month we will consider alternative treatment.  Handout on foods to avoid provided today.  Allergies are not well controlled.  Stop Zyrtec, start Xyzal and Nasonex.  She will reach out to me in the next month or so and if symptoms or  not well controlled we will add Singulair.  Patient to follow up in 1 year for annual exam or sooner if needed.  Vallory Oetken M. Nadine Counts, DO

## 2021-08-04 ENCOUNTER — Other Ambulatory Visit: Payer: Self-pay | Admitting: Obstetrics and Gynecology

## 2021-08-04 DIAGNOSIS — Z1231 Encounter for screening mammogram for malignant neoplasm of breast: Secondary | ICD-10-CM

## 2021-09-21 ENCOUNTER — Ambulatory Visit
Admission: RE | Admit: 2021-09-21 | Discharge: 2021-09-21 | Disposition: A | Discharge status: Home / Self Care | Payer: Managed Care, Other (non HMO) | Source: Ambulatory Visit | Attending: Obstetrics and Gynecology | Admitting: Obstetrics and Gynecology

## 2021-09-21 DIAGNOSIS — Z1231 Encounter for screening mammogram for malignant neoplasm of breast: Secondary | ICD-10-CM

## 2021-09-25 LAB — HM PAP SMEAR: HPV, high-risk: NEGATIVE

## 2021-10-27 ENCOUNTER — Ambulatory Visit (INDEPENDENT_AMBULATORY_CARE_PROVIDER_SITE_OTHER): Payer: No Typology Code available for payment source | Admitting: Family Medicine

## 2021-10-27 ENCOUNTER — Encounter: Payer: Self-pay | Admitting: Family Medicine

## 2021-10-27 VITALS — BP 138/91 | HR 74 | Temp 98.1°F | Ht 69.0 in | Wt 198.2 lb

## 2021-10-27 DIAGNOSIS — B9689 Other specified bacterial agents as the cause of diseases classified elsewhere: Secondary | ICD-10-CM | POA: Diagnosis not present

## 2021-10-27 DIAGNOSIS — J069 Acute upper respiratory infection, unspecified: Secondary | ICD-10-CM | POA: Diagnosis not present

## 2021-10-27 MED ORDER — AZITHROMYCIN 250 MG PO TABS: ORAL_TABLET | ORAL | 0 refills | Status: DC

## 2021-10-27 NOTE — Progress Notes (Signed)
Assessment & Plan:  1. Bacterial upper respiratory infection - azithromycin (ZITHROMAX Z-PAK) 250 MG tablet; Take 2 tablets (500 mg) PO today, then 1 tablet (250 mg) PO daily x4 days.  Dispense: 6 tablet; Refill: 0   Follow up plan: Return if symptoms worsen or fail to improve.  Floy Sabina, NP Student  I personally was present during the history, physical exam, and medical decision-making activities of this service and have verified that the service and findings are accurately documented in the nurse practitioner student's note.  Deliah Boston, MSN, APRN, FNP-C [facility]  Subjective:   Patient ID: Tami Johnson, female    DOB: Feb 01, 1976, 46 y.o.   MRN: [id number]  HPI: Tami Johnson is a 46 y.o. female presenting on 10/27/2021 for Headache (X 3 days on and off)   Patient states she has been having headaches on and off for the last 3 days. She states right now the headache is over her right temporal region and into her eye. She states that she also can't pop her right ear and it feels stuffy. She reports that she was sick with a respiratory virus 2-3 weeks ago that she managed with OTC remedies and has recovered from this, although she is still coughing up green mucus intermittently. She states the mucus has been occurring since winter started so this is not new for her, just the headache and ear fullness.    ROS: Negative unless specifically indicated above in HPI.   Relevant past medical history reviewed and updated as indicated.   Allergies and medications reviewed and updated.   Current Outpatient Medications:    azithromycin (ZITHROMAX Z-PAK) 250 MG tablet, Take 2 tablets (500 mg) PO today, then 1 tablet (250 mg) PO daily x4 days., Disp: 6 tablet, Rfl: 0   hydrochlorothiazide (HYDRODIURIL) 25 MG tablet, Take 1 tablet (25 mg total) by mouth daily., Disp: 90 tablet, Rfl: 3   levocetirizine (XYZAL) 5 MG tablet, Take 1 tablet (5 mg total) by  mouth every evening., Disp: 90 tablet, Rfl: 3   Magnesium 200 MG TABS, Take 1 tablet by mouth daily., Disp: , Rfl:    metoprolol succinate (TOPROL-XL) 25 MG 24 hr tablet, Take 0.5 tablets (12.5 mg total) by mouth daily., Disp: 45 tablet, Rfl: 3   mometasone (NASONEX) 50 MCG/ACT nasal spray, Place 2 sprays into the nose daily., Disp: 3 each, Rfl: 4   pantoprazole (PROTONIX) 40 MG tablet, Take 1 tablet (40 mg total) by mouth daily., Disp: 90 tablet, Rfl: 3  Allergies  Allergen Reactions   Ciprofloxacin Hives   Sulfa Antibiotics Hives   Sulfamethoxazole-Trimethoprim Other (See Comments)   Hydrocodone-Acetaminophen     Objective:   BP (!) 138/91    Pulse 74    Temp 98.1 F (36.7 C) (Temporal)    Ht 5\' 9"  (1.753 m)    Wt 89.9 kg    BMI 29.27 kg/m    Physical Exam Vitals reviewed.  Constitutional:      General: She is not in acute distress.    Appearance: Normal appearance. She is not ill-appearing, toxic-appearing or diaphoretic.  HENT:     Head: Normocephalic and atraumatic.     Right Ear: Ear canal and external ear normal. Decreased hearing noted. Tympanic membrane is scarred.     Left Ear: Hearing, tympanic membrane, ear canal and external ear normal.     Nose: Nose normal.     Mouth/Throat:     Mouth: Mucous membranes  are moist.     Pharynx: Oropharynx is clear.  Eyes:     Extraocular Movements: Extraocular movements intact.     Conjunctiva/sclera: Conjunctivae normal.     Pupils: Pupils are equal, round, and reactive to light.  Pulmonary:     Effort: Pulmonary effort is normal. No respiratory distress.  Abdominal:     General: There is no distension.     Palpations: Abdomen is soft. There is no mass.  Musculoskeletal:        General: Normal range of motion.     Cervical back: Normal range of motion.  Skin:    General: Skin is warm and dry.  Neurological:     General: No focal deficit present.     Mental Status: She is alert and oriented to person, place, and time.      Motor: No weakness.     Gait: Gait normal.  Psychiatric:        Mood and Affect: Mood normal.        Behavior: Behavior normal.        Thought Content: Thought content normal.        Judgment: Judgment normal.

## 2021-10-29 ENCOUNTER — Telehealth (INDEPENDENT_AMBULATORY_CARE_PROVIDER_SITE_OTHER): Payer: No Typology Code available for payment source | Admitting: Nurse Practitioner

## 2021-10-29 ENCOUNTER — Encounter: Payer: Self-pay | Admitting: Nurse Practitioner

## 2021-10-29 DIAGNOSIS — J00 Acute nasopharyngitis [common cold]: Secondary | ICD-10-CM

## 2021-10-29 DIAGNOSIS — R519 Headache, unspecified: Secondary | ICD-10-CM

## 2021-10-29 MED ORDER — CLARITIN-D 12 HOUR 5-120 MG PO TB12: 1.0000 | ORAL_TABLET | Freq: Two times a day (BID) | ORAL | 1 refills | Status: DC

## 2021-10-29 MED ORDER — NAPROXEN 500 MG PO TABS: 500.0000 mg | ORAL_TABLET | Freq: Two times a day (BID) | ORAL | 1 refills | Status: DC

## 2021-10-29 MED ORDER — FLUTICASONE PROPIONATE 50 MCG/ACT NA SUSP: 2.0000 | Freq: Every day | NASAL | 6 refills | Status: DC

## 2021-10-29 NOTE — Progress Notes (Signed)
Virtual Visit  Note Due to COVID-19 pandemic this visit was conducted virtually. This visit type was conducted due to national recommendations for restrictions regarding the COVID-19 Pandemic (e.g. social distancing, sheltering in place) in an effort to limit this patient's exposure and mitigate transmission in our community. All issues noted in this document were discussed and addressed.  A physical exam was not performed with this format.  I connected with Tami Johnson on 10/29/21 at 1:12 by telephone and verified that I am speaking with the correct person using two identifiers. Tami Johnson is currently located at home and no one is currently with her during visit. The provider, Mary-Margaret Daphine Deutscher, FNP is located in their office at time of visit.  I discussed the limitations, risks, security and privacy concerns of performing an evaluation and management service by telephone and the availability of in person appointments. I also discussed with the patient that there may be a patient responsible charge related to this service. The patient expressed understanding and agreed to proceed.   History and Present Illness:  URI  This is a new problem. The current episode started in the past 7 days. The problem has been gradually worsening. There has been no fever. Associated symptoms include congestion, coughing, headaches, rhinorrhea, sneezing and a sore throat. She has tried nothing for the symptoms. The treatment provided mild relief. Went to the doctor Wednesday and was given a zpak for sinus and ear pain. She still has headache and sore throat.    Review of Systems  HENT:  Positive for congestion, rhinorrhea, sneezing and sore throat.   Respiratory:  Positive for cough.   Musculoskeletal:  Negative for myalgias.  Neurological:  Positive for headaches.    Observations/Objective: Alert and oriented- answers all questions appropriately No distress Raspy voice   Assessment  and Plan: Tami Johnson in today with chief complaint of URI   1. Acute nasopharyngitis 2. Acute nonintractable headache, unspecified headache type Force fluids Rest Continue z pak as prescribed OTC decongestant Meds ordered this encounter  Medications   fluticasone (FLONASE) 50 MCG/ACT nasal spray    Sig: Place 2 sprays into both nostrils daily.    Dispense:  16 g    Refill:  6    Order Specific Question:   Supervising Provider    Answer:   Arville Care A [[id number]]   naproxen (NAPROSYN) 500 MG tablet    Sig: Take 1 tablet (500 mg total) by mouth 2 (two) times daily with a meal.    Dispense:  60 tablet    Refill:  1    Order Specific Question:   Supervising Provider    Answer:   Arville Care A [[id number]]   loratadine-pseudoephedrine (CLARITIN-D 12 HOUR) 5-120 MG tablet    Sig: Take 1 tablet by mouth 2 (two) times daily.    Dispense:  30 tablet    Refill:  1    Order Specific Question:   Supervising Provider    Answer:   Arville Care A [[id number]]      Follow Up Instructions: prn    I discussed the assessment and treatment plan with the patient. The patient was provided an opportunity to ask questions and all were answered. The patient agreed with the plan and demonstrated an understanding of the instructions.   The patient was advised to call back or seek an in-person evaluation if the symptoms worsen or if the condition fails to improve as anticipated.  The above assessment and  management plan was discussed with the patient. The patient verbalized understanding of and has agreed to the management plan. Patient is aware to call the clinic if symptoms persist or worsen. Patient is aware when to return to the clinic for a follow-up visit. Patient educated on when it is appropriate to go to the emergency department.   Time call ended:  1:25  I provided 13 minutes of  non face-to-face time during this encounter.    Mary-Margaret Daphine Deutscher, FNP

## 2021-11-03 IMAGING — MG DIGITAL SCREENING BILAT W/ TOMO W/ CAD
8 series · 8 of 24 positions shown · non-contrast
Comparison: Previous exam(s).

CLINICAL DATA: Screening.

EXAM:
DIGITAL SCREENING BILATERAL MAMMOGRAM WITH TOMO AND CAD

[R MLO synth-2D]
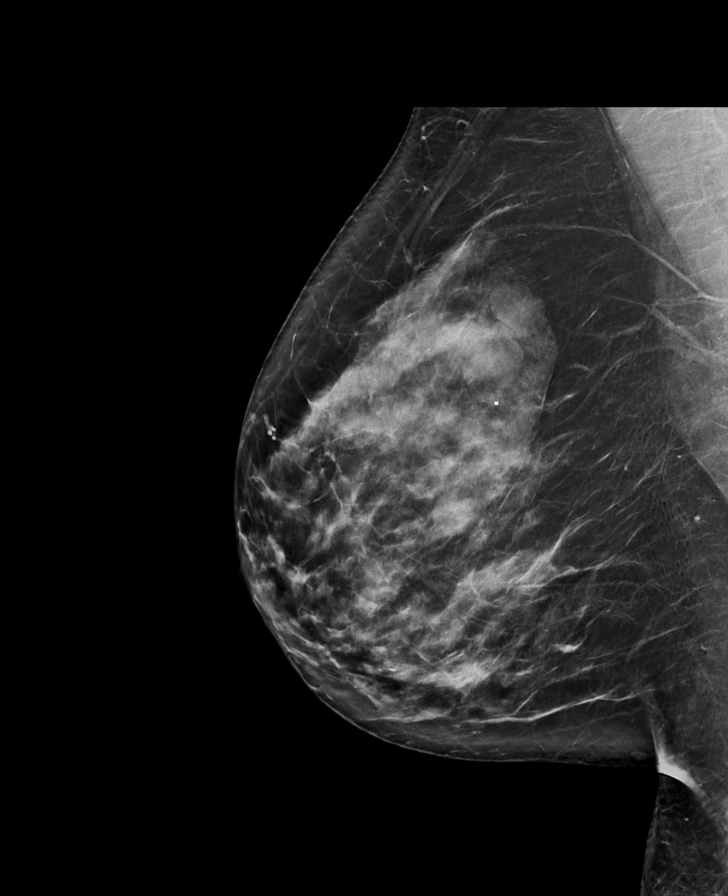

[L CC synth-2D]
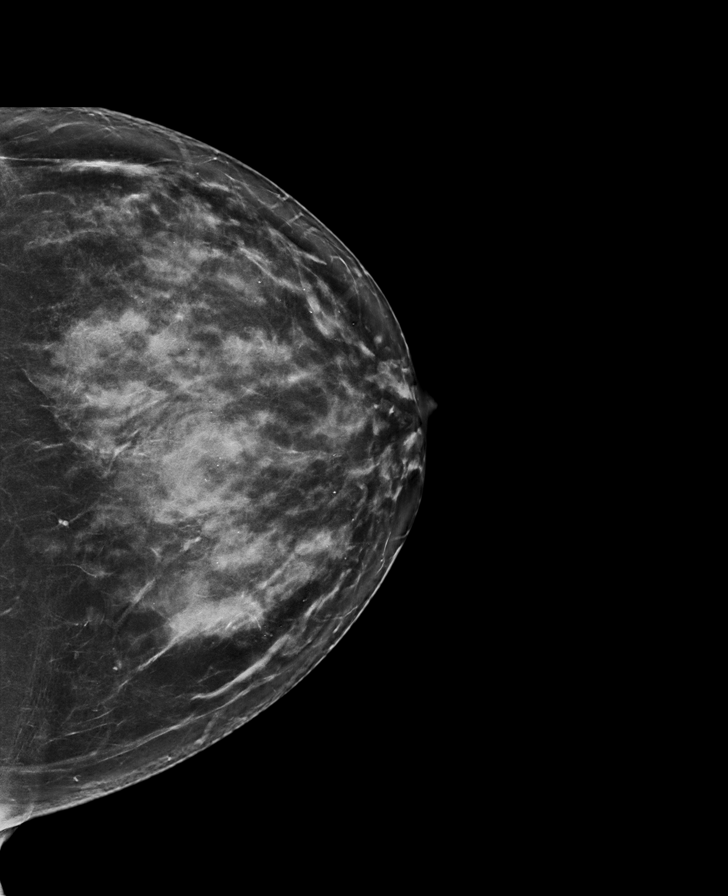

[L MLO synth-2D]
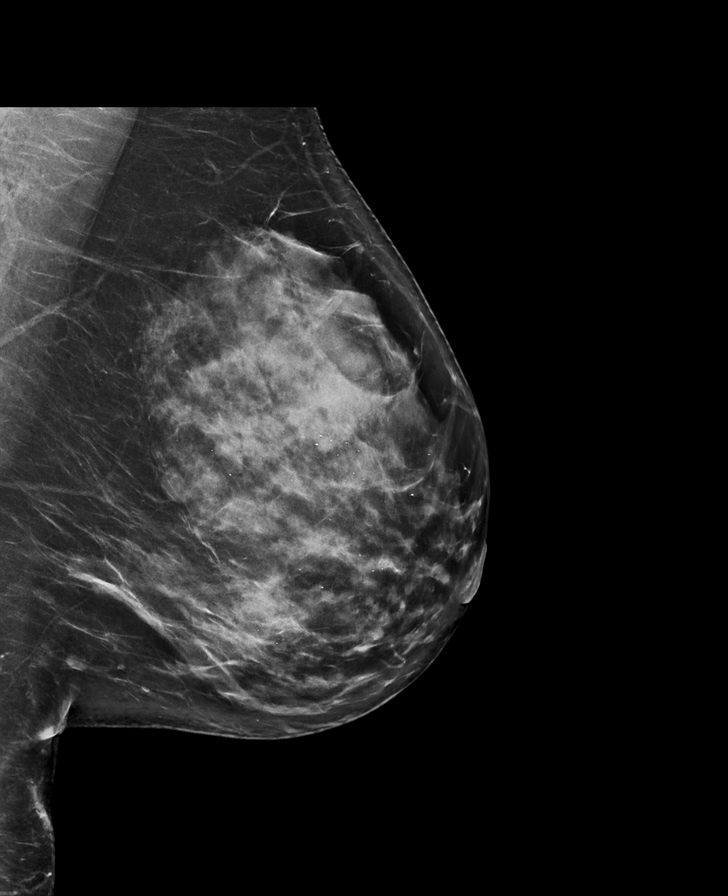

[R CC synth-2D]
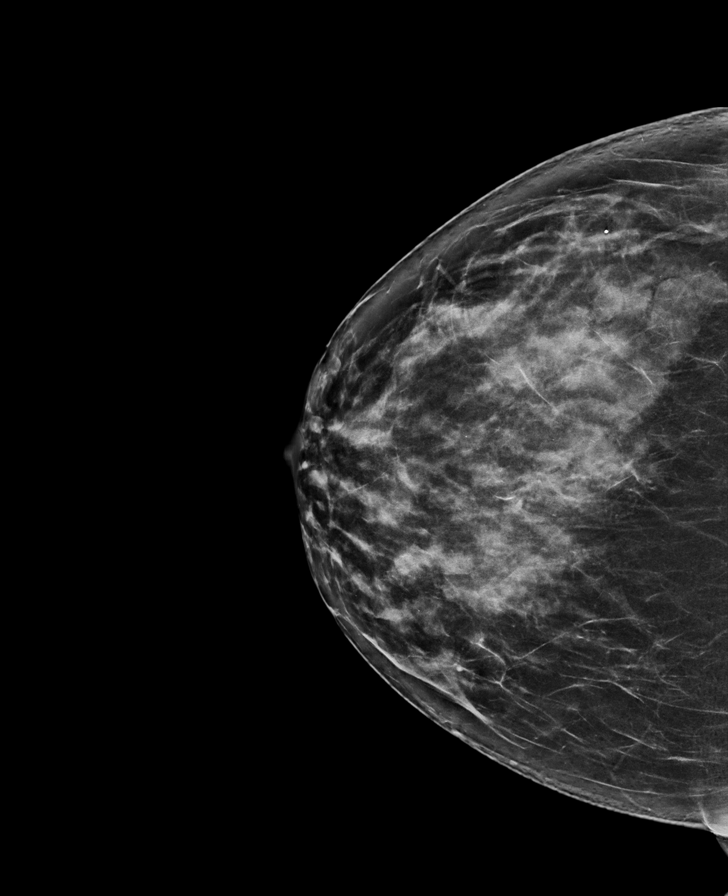

[L CC tomo · tomo slice 43/84.0]
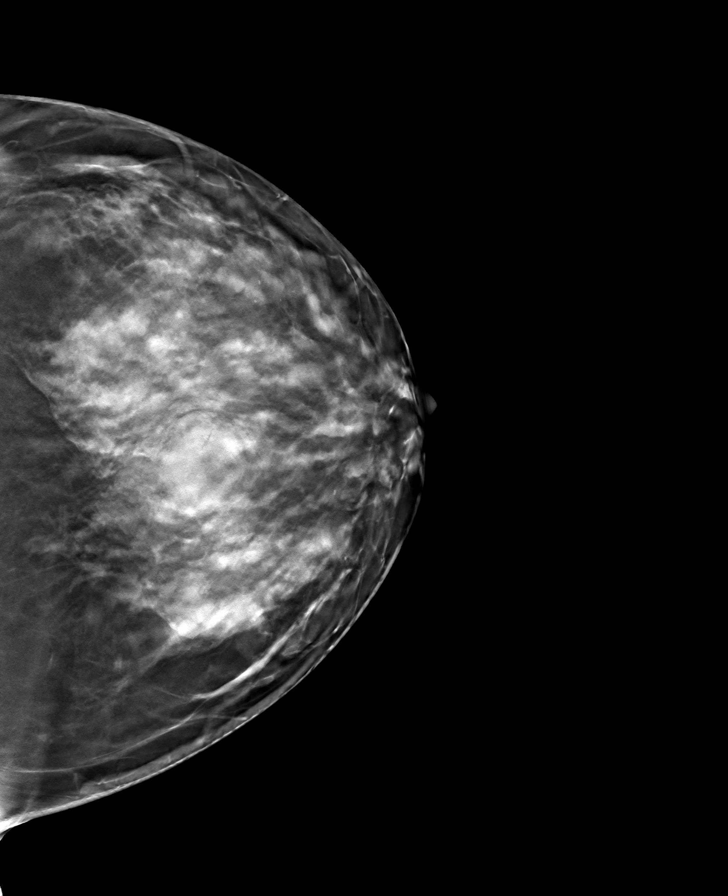

[R MLO tomo · tomo slice 47/92.0]
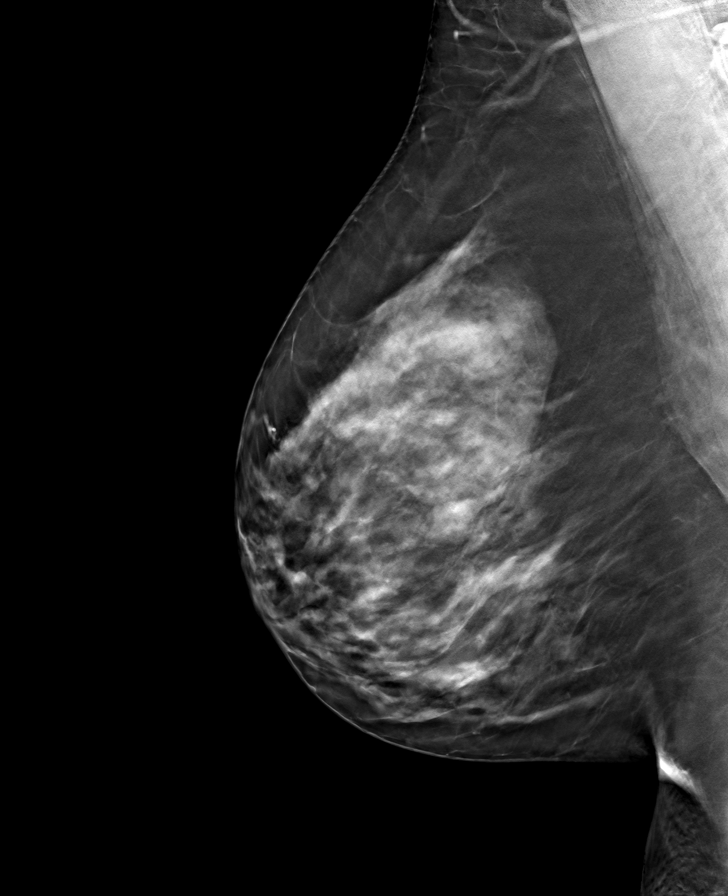

[L MLO tomo · tomo slice 45/90.0]
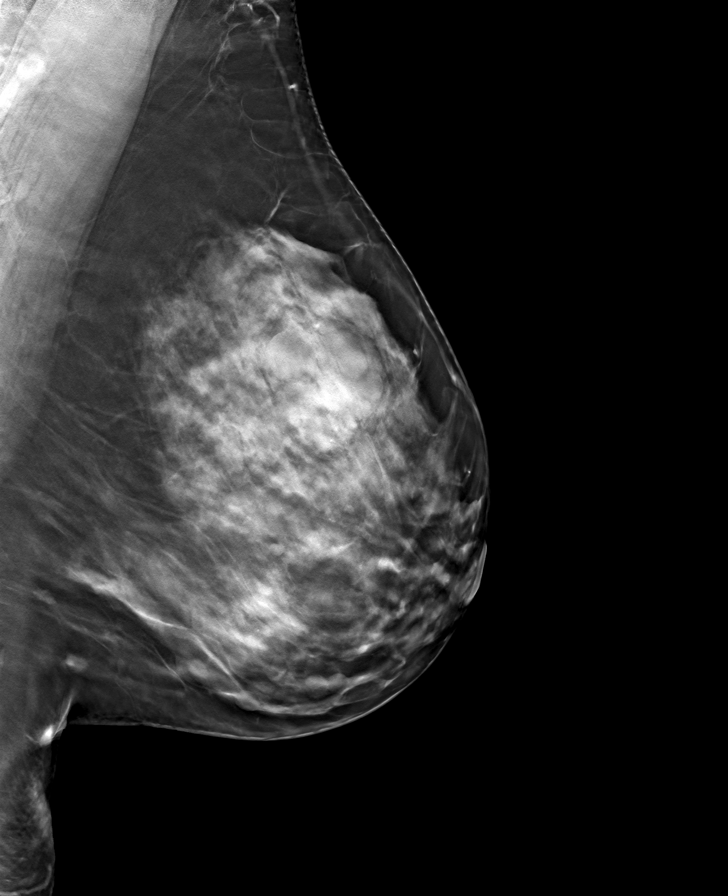

[R CC tomo · tomo slice 40/79.0]
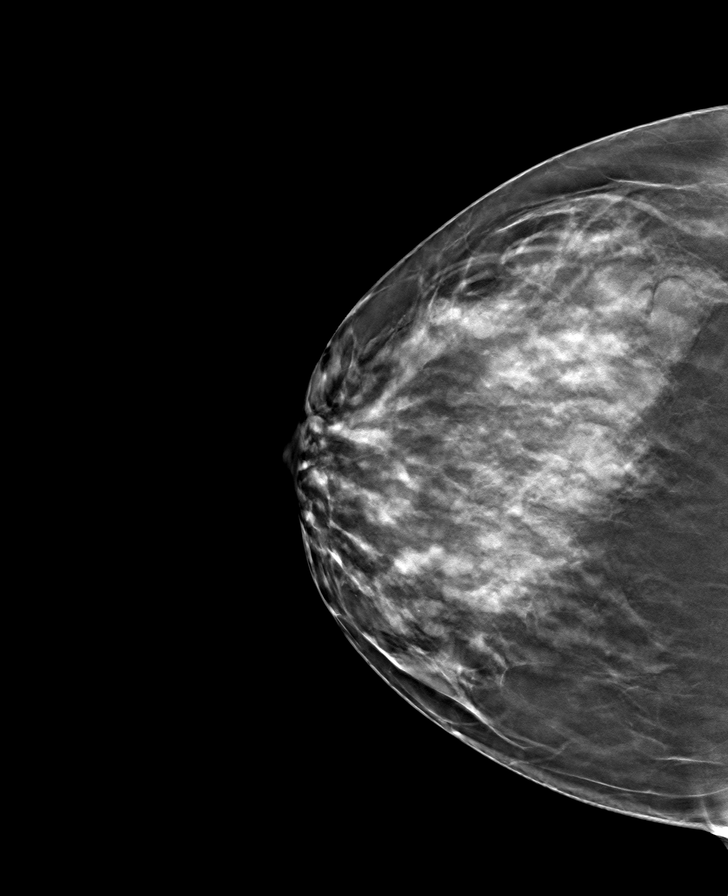

[8 of 24 positions shown; findings below may reference images not displayed]

ACR Breast Density Category c: The breast tissue is heterogeneously
dense, which may obscure small masses.
FINDINGS: There are no findings suspicious for malignancy. Waxing and waning
circumscribed equal density masses bilaterally are consistent with a
changing cystic pattern. Images were processed with CAD.
IMPRESSION: No mammographic evidence of malignancy. A result letter of this
screening mammogram will be mailed directly to the patient.

RECOMMENDATION:
Screening mammogram in one year. (Code:[id number])

BI-RADS CATEGORY  2: Benign.

## 2022-04-02 ENCOUNTER — Other Ambulatory Visit: Payer: Self-pay | Admitting: Family Medicine

## 2022-04-02 DIAGNOSIS — K219 Gastro-esophageal reflux disease without esophagitis: Secondary | ICD-10-CM

## 2022-04-02 DIAGNOSIS — J301 Allergic rhinitis due to pollen: Secondary | ICD-10-CM

## 2022-04-02 DIAGNOSIS — I10 Essential (primary) hypertension: Secondary | ICD-10-CM

## 2022-04-04 NOTE — Telephone Encounter (Signed)
Pt called to schedule an appt with Dr Nadine Counts for a CPE before she runs out of her medicines.  Explained to pt that right now Dr Nadine Counts is booking out until Sept 8th for physical appts.  Pt says she will be out of medicine before then. Asked pt if she wanted me to see if we could schedule her for an OV instead just for med refill. Pt declined. Wants to be seen for CPE and med refill at the same time.  Can Dr Nadine Counts work pt in to be seen for a CPE soon? Or can we send in refills to last pt until she can be seen for a cpe?  Please advise and call pt.

## 2022-04-07 ENCOUNTER — Telehealth: Payer: Self-pay | Admitting: Family Medicine

## 2022-04-07 NOTE — Telephone Encounter (Signed)
  Prescription Request  04/07/2022  Is this a "Controlled Substance" medicine? NO  Have you seen your PCP in the last 2 weeks? No pt has first available appt on 06/06/2022 with Dr. Reece Agar for CPE, she is about to run out   If YES, route message to pool  -  If NO, patient needs to be scheduled for appointment.  What is the name of the medication or equipment? hydrochlorothiazide (HYDRODIURIL) 25 MG tablet  pantoprazole (PROTONIX) 40 MG tablet levocetirizine (XYZAL) 5 MG tablet  metoprolol succinate (TOPROL-XL) 25 MG 24 hr tablet  fluticasone (FLONASE) 50 MCG/ACT nasal spray    Have you contacted your pharmacy to request a refill? yes   Which pharmacy would you like this sent to? [facility]    Patient notified that their request is being sent to the clinical staff for review and that they should receive a response within 2 business days.

## 2022-04-08 ENCOUNTER — Other Ambulatory Visit: Payer: Self-pay

## 2022-04-08 ENCOUNTER — Other Ambulatory Visit: Payer: Self-pay | Admitting: Family Medicine

## 2022-04-08 DIAGNOSIS — I10 Essential (primary) hypertension: Secondary | ICD-10-CM

## 2022-04-08 DIAGNOSIS — E7800 Pure hypercholesterolemia, unspecified: Secondary | ICD-10-CM

## 2022-04-08 DIAGNOSIS — J301 Allergic rhinitis due to pollen: Secondary | ICD-10-CM

## 2022-04-08 MED ORDER — FLUTICASONE PROPIONATE 50 MCG/ACT NA SUSP: 2.0000 | Freq: Every day | NASAL | 3 refills | Status: DC

## 2022-04-08 NOTE — Telephone Encounter (Signed)
Looks like someone already took care of these

## 2022-05-02 ENCOUNTER — Other Ambulatory Visit: Payer: Self-pay | Admitting: Family Medicine

## 2022-05-02 DIAGNOSIS — J301 Allergic rhinitis due to pollen: Secondary | ICD-10-CM

## 2022-05-02 DIAGNOSIS — I10 Essential (primary) hypertension: Secondary | ICD-10-CM

## 2022-05-02 DIAGNOSIS — K219 Gastro-esophageal reflux disease without esophagitis: Secondary | ICD-10-CM

## 2022-05-23 ENCOUNTER — Other Ambulatory Visit: Payer: No Typology Code available for payment source

## 2022-05-23 DIAGNOSIS — I10 Essential (primary) hypertension: Secondary | ICD-10-CM

## 2022-05-23 DIAGNOSIS — E7800 Pure hypercholesterolemia, unspecified: Secondary | ICD-10-CM

## 2022-05-23 LAB — LIPID PANEL
Chol/HDL Ratio: 3.9 ratio (ref 0.0–4.4)
Cholesterol, Total: 183 mg/dL (ref 100–199)
HDL: 47 mg/dL (ref >39)
LDL Chol Calc (NIH): 123 mg/dL — ABNORMAL HIGH (ref 0–99)
Triglycerides: 70 mg/dL (ref 0–149)
VLDL Cholesterol Cal: 13 mg/dL (ref 5–40)

## 2022-05-23 LAB — CBC WITH DIFFERENTIAL/PLATELET
Basophils Absolute: 0.1 10*3/uL (ref 0.0–0.2)
Basos: 1 %
EOS (ABSOLUTE): 0.3 10*3/uL (ref 0.0–0.4)
Eos: 4 %
Hematocrit: 39.4 % (ref 34.0–46.6)
Hemoglobin: 13 g/dL (ref 11.1–15.9)
Immature Grans (Abs): 0 10*3/uL (ref 0.0–0.1)
Immature Granulocytes: 0 %
Lymphocytes Absolute: 1.9 10*3/uL (ref 0.7–3.1)
Lymphs: 30 %
MCH: 27.1 pg (ref 26.6–33.0)
MCHC: 33 g/dL (ref 31.5–35.7)
MCV: 82 fL (ref 79–97)
Monocytes Absolute: 0.5 10*3/uL (ref 0.1–0.9)
Monocytes: 8 %
Neutrophils Absolute: 3.7 10*3/uL (ref 1.4–7.0)
Neutrophils: 57 %
Platelets: 293 10*3/uL (ref 150–450)
RBC: 4.8 x10E6/uL (ref 3.77–5.28)
RDW: 14.5 % (ref 11.7–15.4)
WBC: 6.4 10*3/uL (ref 3.4–10.8)

## 2022-05-23 LAB — CMP14+EGFR
ALT: 14 IU/L (ref 0–32)
AST: 16 IU/L (ref 0–40)
Albumin/Globulin Ratio: 1.8 (ref 1.2–2.2)
Albumin: 4.2 g/dL (ref 3.9–4.9)
Alkaline Phosphatase: 69 IU/L (ref 44–121)
BUN/Creatinine Ratio: 12 (ref 9–23)
BUN: 8 mg/dL (ref 6–24)
Bilirubin Total: 0.3 mg/dL (ref 0.0–1.2)
CO2: 23 mmol/L (ref 20–29)
Calcium: 9.2 mg/dL (ref 8.7–10.2)
Chloride: 103 mmol/L (ref 96–106)
Creatinine, Ser: 0.67 mg/dL (ref 0.57–1.00)
Globulin, Total: 2.4 g/dL (ref 1.5–4.5)
Glucose: 110 mg/dL — ABNORMAL HIGH (ref 70–99)
Potassium: 3.9 mmol/L (ref 3.5–5.2)
Sodium: 139 mmol/L (ref 134–144)
Total Protein: 6.6 g/dL (ref 6.0–8.5)
eGFR: 110 mL/min/{1.73_m2} (ref >59)

## 2022-05-24 ENCOUNTER — Other Ambulatory Visit: Payer: Self-pay

## 2022-05-24 DIAGNOSIS — R7309 Other abnormal glucose: Secondary | ICD-10-CM

## 2022-05-24 DIAGNOSIS — R739 Hyperglycemia, unspecified: Secondary | ICD-10-CM

## 2022-05-26 ENCOUNTER — Telehealth: Payer: Self-pay | Admitting: Family Medicine

## 2022-05-26 LAB — HGB A1C W/O EAG: Hgb A1c MFr Bld: 6.1 % — ABNORMAL HIGH (ref 4.8–5.6)

## 2022-05-26 LAB — SPECIMEN STATUS REPORT

## 2022-05-26 NOTE — Telephone Encounter (Signed)
I called pt back and she had a question about her recent labs and what the A1C results were. I advised pt her A1C did show prediabetes and Dr Reece Agar would review it tomorrow and go over further at her appt 9/11 for CPE but at this time she should just watch her diet and limit her intake of sweets and carbohydrates and to exercise. Pt voiced understanding.

## 2022-06-02 ENCOUNTER — Other Ambulatory Visit: Payer: Self-pay | Admitting: Family Medicine

## 2022-06-02 DIAGNOSIS — J301 Allergic rhinitis due to pollen: Secondary | ICD-10-CM

## 2022-06-02 DIAGNOSIS — I10 Essential (primary) hypertension: Secondary | ICD-10-CM

## 2022-06-02 DIAGNOSIS — K219 Gastro-esophageal reflux disease without esophagitis: Secondary | ICD-10-CM

## 2022-06-06 ENCOUNTER — Encounter: Payer: Self-pay | Admitting: Family Medicine

## 2022-06-06 ENCOUNTER — Ambulatory Visit (INDEPENDENT_AMBULATORY_CARE_PROVIDER_SITE_OTHER): Payer: No Typology Code available for payment source | Admitting: Family Medicine

## 2022-06-06 VITALS — BP 134/84 | HR 79 | Temp 98.4°F | Ht 69.0 in | Wt 198.2 lb

## 2022-06-06 DIAGNOSIS — Z1211 Encounter for screening for malignant neoplasm of colon: Secondary | ICD-10-CM

## 2022-06-06 DIAGNOSIS — R7303 Prediabetes: Secondary | ICD-10-CM | POA: Diagnosis not present

## 2022-06-06 DIAGNOSIS — I10 Essential (primary) hypertension: Secondary | ICD-10-CM

## 2022-06-06 DIAGNOSIS — N898 Other specified noninflammatory disorders of vagina: Secondary | ICD-10-CM | POA: Diagnosis not present

## 2022-06-06 DIAGNOSIS — K219 Gastro-esophageal reflux disease without esophagitis: Secondary | ICD-10-CM

## 2022-06-06 DIAGNOSIS — Z124 Encounter for screening for malignant neoplasm of cervix: Secondary | ICD-10-CM | POA: Diagnosis not present

## 2022-06-06 DIAGNOSIS — E7800 Pure hypercholesterolemia, unspecified: Secondary | ICD-10-CM

## 2022-06-06 DIAGNOSIS — J301 Allergic rhinitis due to pollen: Secondary | ICD-10-CM

## 2022-06-06 DIAGNOSIS — Z0001 Encounter for general adult medical examination with abnormal findings: Secondary | ICD-10-CM

## 2022-06-06 DIAGNOSIS — Z0000 Encounter for general adult medical examination without abnormal findings: Secondary | ICD-10-CM

## 2022-06-06 LAB — MICROSCOPIC EXAMINATION: Renal Epithel, UA: NONE SEEN /hpf

## 2022-06-06 LAB — URINALYSIS, ROUTINE W REFLEX MICROSCOPIC
Bilirubin, UA: NEGATIVE
Glucose, UA: NEGATIVE
Ketones, UA: NEGATIVE
Leukocytes,UA: NEGATIVE
Nitrite, UA: NEGATIVE
Protein,UA: NEGATIVE
Specific Gravity, UA: 1.01 (ref 1.005–1.030)
Urobilinogen, Ur: 0.2 mg/dL (ref 0.2–1.0)
pH, UA: 6.5 (ref 5.0–7.5)

## 2022-06-06 LAB — WET PREP FOR TRICH, YEAST, CLUE
Clue Cell Exam: NEGATIVE
Trichomonas Exam: NEGATIVE
Yeast Exam: NEGATIVE

## 2022-06-06 MED ORDER — METOPROLOL SUCCINATE ER 25 MG PO TB24: 12.5000 mg | ORAL_TABLET | Freq: Every day | ORAL | 3 refills | Status: DC

## 2022-06-06 MED ORDER — HYDROCHLOROTHIAZIDE 25 MG PO TABS: 25.0000 mg | ORAL_TABLET | Freq: Every day | ORAL | 3 refills | Status: DC

## 2022-06-06 MED ORDER — LEVOCETIRIZINE DIHYDROCHLORIDE 5 MG PO TABS: ORAL_TABLET | ORAL | 3 refills | Status: DC

## 2022-06-06 MED ORDER — FLUTICASONE PROPIONATE 50 MCG/ACT NA SUSP: 2.0000 | Freq: Every day | NASAL | 3 refills | Status: DC

## 2022-06-06 MED ORDER — PANTOPRAZOLE SODIUM 40 MG PO TBEC: DELAYED_RELEASE_TABLET | ORAL | 3 refills | Status: DC

## 2022-06-06 NOTE — Progress Notes (Signed)
Tami Johnson is a 46 y.o. female presents to office today for annual physical exam examination.    Concerns today include: 1.  Vaginal odor Patient reports that she has had a history of recurrent yeast vaginitis and is currently treated with Diflucan.  By her OB/GYN, who she will see in December.  She sees Dr. Jackelyn Knife in [place].  She has been having intermittent episodes of vaginal/urinary odors.  These occur at random and are not associated with sexual intercourse.  She does not have a menstrual cycle anymore.  Denies any other physiologic changes.  No significant discharge appreciated.  Was not sure what she should do about this given its intermittent nature.  She has previously required antibiotics for treatment  Diet: Not restricted, Exercise: No structured Last eye exam: Up-to-date Last dental exam: Months Last colonoscopy: needs wants to go to [place] where her husband goes Last mammogram: UTD Last pap smear: needs Refills needed today: All Immunizations needed: Immunization History  Administered Date(s) Administered   Influenza Split 07/11/2013   Influenza,inj,Quad PF,6+ Mos 07/13/2015, 07/07/2016   Influenza-Unspecified 07/14/2014, 07/12/2016, 07/24/2019   Moderna Sars-Covid-2 Vaccination 02/17/2020, 03/17/2020   Td 12/10/2014   No past medical history on file. Social History   Socioeconomic History   Marital status: Married    Spouse name: Not on file   Number of children: Not on file   Years of education: Not on file   Highest education level: Not on file  Occupational History   Not on file  Tobacco Use   Smoking status: Never   Smokeless tobacco: Never  Vaping Use   Vaping Use: Never used  Substance and Sexual Activity   Alcohol use: No   Drug use: No   Sexual activity: Yes    Birth control/protection: None  Other Topics Concern   Not on file  Social History Narrative   Not on file   Social Determinants of Health   Financial Resource  Strain: Not on file  Food Insecurity: Not on file  Transportation Needs: Not on file  Physical Activity: Not on file  Stress: Not on file  Social Connections: Not on file  Intimate Partner Violence: Not on file   Past Surgical History:  Procedure Laterality Date   FINGER SURGERY  06/10/15   Family History  Problem Relation Age of Onset   Hypertension Father    Heart attack Neg Hx    Stroke Neg Hx     Current Outpatient Medications:    fluticasone (FLONASE) 50 MCG/ACT nasal spray, Place 2 sprays into both nostrils daily., Disp: 16 g, Rfl: 3   hydrochlorothiazide (HYDRODIURIL) 25 MG tablet, TAKE 1 TABLET (25 MG TOTAL) BY MOUTH DAILY. (NEEDS TO BE SEEN BEFORE NEXT REFILL), Disp: 30 tablet, Rfl: 0   levocetirizine (XYZAL) 5 MG tablet, TAKE 1 TABLET BY MOUTH ONCE EVERY EVENING *NEEDS SEEN BEFORE NEXT REFILL*, Disp: 30 tablet, Rfl: 0   loratadine-pseudoephedrine (CLARITIN-D 12 HOUR) 5-120 MG tablet, Take 1 tablet by mouth 2 (two) times daily., Disp: 30 tablet, Rfl: 1   Magnesium 200 MG TABS, Take 1 tablet by mouth daily., Disp: , Rfl:    metoprolol succinate (TOPROL-XL) 25 MG 24 hr tablet, TAKE 1/2 TABLET BY MOUTH DAILY, Disp: 45 tablet, Rfl: 1   mometasone (NASONEX) 50 MCG/ACT nasal spray, Place 2 sprays into the nose daily., Disp: 3 each, Rfl: 4   naproxen (NAPROSYN) 500 MG tablet, Take 1 tablet (500 mg total) by mouth 2 (two) times daily  with a meal., Disp: 60 tablet, Rfl: 1   pantoprazole (PROTONIX) 40 MG tablet, TAKE 1 TABLET BY MOUTH EVERY DAY (40 MG TOTAL)-NEEDS TO BE SEEN BEFORE NEXT REFILL, Disp: 30 tablet, Rfl: 0  Allergies  Allergen Reactions   Ciprofloxacin Hives   Sulfa Antibiotics Hives   Sulfamethoxazole-Trimethoprim Other (See Comments)   Hydrocodone-Acetaminophen      ROS: Review of Systems A comprehensive review of systems was negative except for: Eyes: positive for contacts/glasses Genitourinary: positive for vaginal odor/ discharge    Physical exam BP 134/84    Pulse 79   Temp 98.4 F (36.9 C) (Temporal)   Ht 5\' 9"  (1.753 m)   Wt 198 lb 3.2 oz (89.9 kg)   SpO2 96%   BMI 29.27 kg/m  General appearance: alert, cooperative, appears stated age, and no distress Head: Normocephalic, without obvious abnormality, atraumatic Eyes: negative findings: lids and lashes normal, conjunctivae and sclerae normal, corneas clear, and pupils equal, round, reactive to light and accomodation Ears: normal TM's and external ear canals both ears Nose: Nares normal. Septum midline. Mucosa normal. No drainage or sinus tenderness. Throat: lips, mucosa, and tongue normal; teeth and gums normal Neck: no adenopathy, supple, symmetrical, trachea midline, and thyroid not enlarged, symmetric, no tenderness/mass/nodules Back: symmetric, no curvature. ROM normal. No CVA tenderness. Lungs: clear to auscultation bilaterally Heart: regular rate and rhythm, S1, S2 normal, no murmur, click, rub or gallop Abdomen: soft, non-tender; bowel sounds normal; no masses,  no organomegaly Extremities: extremities normal, atraumatic, no cyanosis or edema Pulses: 2+ and symmetric Skin:  Several pigmented nevi and cherry hemangiomas noted along the lower extremities Lymph nodes: Cervical, supraclavicular, and axillary nodes normal. Neurologic: Grossly normal  Psych: Mood stable, speech normal, affect appropriate  Flowsheet Row Office Visit from 06/06/2022 in 08/06/2022 Family Medicine  PHQ-2 Total Score 0         Assessment/ Plan: [name] here for annual physical exam.   Annual physical exam  Screen for colon cancer - Plan: Ambulatory referral to Gastroenterology  Screening for malignant neoplasm of cervix  Pure hypercholesterolemia  Primary hypertension - Plan: hydrochlorothiazide (HYDRODIURIL) 25 MG tablet, metoprolol succinate (TOPROL-XL) 25 MG 24 hr tablet  Pre-diabetes  Seasonal allergic rhinitis due to pollen - Plan: levocetirizine (XYZAL) 5 MG  tablet  Gastroesophageal reflux disease without esophagitis - Plan: pantoprazole (PROTONIX) 40 MG tablet  Vaginal odor - Plan: WET PREP FOR TRICH, YEAST, CLUE, Urinalysis, Routine w reflex microscopic  Referral for colon cancer screening to [place] placed.  Screening for cervical cancer deferred to her OB/GYN in December  We reviewed her labs and I completed her work form today.  Highly recommended reducing carbohydrate intake as she does have evidence of prediabetes on laboratory work-up.  BP is well controlled.  Meds have been renewed.  Allergy symptoms are well controlled with current regimen.  PPI renewed.  Okay to switch to evening dosage.  Okay to use Pepcid if needed  Uncertain etiology of vaginal odor but we will obtain wet prep, urinalysis to further investigate.  Discussed use of vaginal probiotic.  Suspect that she may have some intermittent bacterial vaginosis.  Will treat pending results  Counseled on healthy lifestyle choices, including diet (rich in fruits, vegetables and lean meats and low in salt and simple carbohydrates) and exercise (at least 30 minutes of moderate physical activity daily).  Patient to follow up in 1 year for annual exam or sooner if needed.  Maahi Lannan M. January, DO

## 2022-06-06 NOTE — Patient Instructions (Signed)
Probiotic for vaginal health  Preventive Care 35-45 Years Old, Female Preventive care refers to lifestyle choices and visits with your health care provider that can promote health and wellness. Preventive care visits are also called wellness exams. What can I expect for my preventive care visit? Counseling Your health care provider may ask you questions about your: Medical history, including: Past medical problems. Family medical history. Pregnancy history. Current health, including: Menstrual cycle. Method of birth control. Emotional well-being. Home life and relationship well-being. Sexual activity and sexual health. Lifestyle, including: Alcohol, nicotine or tobacco, and drug use. Access to firearms. Diet, exercise, and sleep habits. Work and work Statistician. Sunscreen use. Safety issues such as seatbelt and bike helmet use. Physical exam Your health care provider will check your: Height and weight. These may be used to calculate your BMI (body mass index). BMI is a measurement that tells if you are at a healthy weight. Waist circumference. This measures the distance around your waistline. This measurement also tells if you are at a healthy weight and may help predict your risk of certain diseases, such as type 2 diabetes and high blood pressure. Heart rate and blood pressure. Body temperature. Skin for abnormal spots. What immunizations do I need?  Vaccines are usually given at various ages, according to a schedule. Your health care provider will recommend vaccines for you based on your age, medical history, and lifestyle or other factors, such as travel or where you work. What tests do I need? Screening Your health care provider may recommend screening tests for certain conditions. This may include: Lipid and cholesterol levels. Diabetes screening. This is done by checking your blood sugar (glucose) after you have not eaten for a while (fasting). Pelvic exam and Pap  test. Hepatitis B test. Hepatitis C test. HIV (human immunodeficiency virus) test. STI (sexually transmitted infection) testing, if you are at risk. Lung cancer screening. Colorectal cancer screening. Mammogram. Talk with your health care provider about when you should start having regular mammograms. This may depend on whether you have a family history of breast cancer. BRCA-related cancer screening. This may be done if you have a family history of breast, ovarian, tubal, or peritoneal cancers. Bone density scan. This is done to screen for osteoporosis. Talk with your health care provider about your test results, treatment options, and if necessary, the need for more tests. Follow these instructions at home: Eating and drinking  Eat a diet that includes fresh fruits and vegetables, whole grains, lean protein, and low-fat dairy products. Take vitamin and mineral supplements as recommended by your health care provider. Do not drink alcohol if: Your health care provider tells you not to drink. You are pregnant, may be pregnant, or are planning to become pregnant. If you drink alcohol: Limit how much you have to 0-1 drink a day. Know how much alcohol is in your drink. In the [place] one drink equals one 12 oz bottle of beer (355 mL), one 5 oz glass of wine (148 mL), or one 1 oz glass of hard liquor (44 mL). Lifestyle Brush your teeth every morning and night with fluoride toothpaste. Floss one time each day. Exercise for at least 30 minutes 5 or more days each week. Do not use any products that contain nicotine or tobacco. These products include cigarettes, chewing tobacco, and vaping devices, such as e-cigarettes. If you need help quitting, ask your health care provider. Do not use drugs. If you are sexually active, practice safe sex. Use a condom or  other form of protection to prevent STIs. If you do not wish to become pregnant, use a form of birth control. If you plan to become pregnant,  see your health care provider for a prepregnancy visit. Take aspirin only as told by your health care provider. Make sure that you understand how much to take and what form to take. Work with your health care provider to find out whether it is safe and beneficial for you to take aspirin daily. Find healthy ways to manage stress, such as: Meditation, yoga, or listening to music. Journaling. Talking to a trusted person. Spending time with friends and family. Minimize exposure to UV radiation to reduce your risk of skin cancer. Safety Always wear your seat belt while driving or riding in a vehicle. Do not drive: If you have been drinking alcohol. Do not ride with someone who has been drinking. When you are tired or distracted. While texting. If you have been using any mind-altering substances or drugs. Wear a helmet and other protective equipment during sports activities. If you have firearms in your house, make sure you follow all gun safety procedures. Seek help if you have been physically or sexually abused. What's next? Visit your health care provider once a year for an annual wellness visit. Ask your health care provider how often you should have your eyes and teeth checked. Stay up to date on all vaccines. This information is not intended to replace advice given to you by your health care provider. Make sure you discuss any questions you have with your health care provider. Document Revised: 03/10/2021 Document Reviewed: 03/10/2021 Elsevier Patient Education  [place].

## 2022-06-24 ENCOUNTER — Encounter: Payer: Self-pay | Admitting: Gastroenterology

## 2022-07-11 DIAGNOSIS — N760 Acute vaginitis: Secondary | ICD-10-CM | POA: Insufficient documentation

## 2022-07-11 DIAGNOSIS — B9689 Other specified bacterial agents as the cause of diseases classified elsewhere: Secondary | ICD-10-CM | POA: Insufficient documentation

## 2022-07-11 DIAGNOSIS — N390 Urinary tract infection, site not specified: Secondary | ICD-10-CM | POA: Insufficient documentation

## 2022-07-11 DIAGNOSIS — N921 Excessive and frequent menstruation with irregular cycle: Secondary | ICD-10-CM | POA: Insufficient documentation

## 2022-07-11 DIAGNOSIS — N920 Excessive and frequent menstruation with regular cycle: Secondary | ICD-10-CM | POA: Insufficient documentation

## 2022-07-11 DIAGNOSIS — I499 Cardiac arrhythmia, unspecified: Secondary | ICD-10-CM | POA: Insufficient documentation

## 2022-07-11 HISTORY — DX: Other specified bacterial agents as the cause of diseases classified elsewhere: B96.89

## 2022-07-15 ENCOUNTER — Ambulatory Visit: Payer: No Typology Code available for payment source | Admitting: Family

## 2022-07-15 ENCOUNTER — Encounter: Payer: Self-pay | Admitting: Family

## 2022-07-15 VITALS — BP 130/87 | HR 86 | Temp 97.8°F | Ht 69.0 in | Wt 198.4 lb

## 2022-07-15 DIAGNOSIS — J020 Streptococcal pharyngitis: Secondary | ICD-10-CM

## 2022-07-15 DIAGNOSIS — J029 Acute pharyngitis, unspecified: Secondary | ICD-10-CM | POA: Diagnosis not present

## 2022-07-15 LAB — RAPID STREP SCREEN (MED CTR MEBANE ONLY): Strep Gp A Ag, IA W/Reflex: POSITIVE — AB

## 2022-07-15 MED ORDER — AMOXICILLIN 500 MG PO CAPS: 500.0000 mg | ORAL_CAPSULE | Freq: Two times a day (BID) | ORAL | 0 refills | Status: AC

## 2022-07-15 NOTE — Progress Notes (Signed)
Subjective:    Patient ID: Tami Johnson, female    DOB: [birth year], 46 y.o.   MRN: [id number]  Chief Complaint  Patient presents with   Sore Throat   PT presents to the office today for sore throat that started yesterday.  Sore Throat  This is a new problem. The current episode started more than 1 year ago. The problem has been rapidly worsening. The maximum temperature recorded prior to her arrival was 100.4 - 100.9 F. The pain is at a severity of 8/10. The pain is moderate. Associated symptoms include congestion, headaches and swollen glands. Pertinent negatives include no coughing, ear discharge, ear pain, hoarse voice, shortness of breath or vomiting. She has had no exposure to strep or mono. She has tried acetaminophen for the symptoms. The treatment provided mild relief.      Review of Systems  HENT:  Positive for congestion. Negative for ear discharge, ear pain and hoarse voice.   Respiratory:  Negative for cough and shortness of breath.   Gastrointestinal:  Negative for vomiting.  Neurological:  Positive for headaches.  All other systems reviewed and are negative.      Objective:   Physical Exam Vitals reviewed.  Constitutional:      General: She is not in acute distress.    Appearance: She is well-developed.  HENT:     Head: Normocephalic and atraumatic.     Right Ear: Tympanic membrane normal.     Left Ear: Tympanic membrane normal.     Mouth/Throat:     Pharynx: Posterior oropharyngeal erythema present.     Tonsils: Tonsillar exudate present.      Comments: Uvula swollen and touching right tonsil  Eyes:     Pupils: Pupils are equal, round, and reactive to light.  Neck:     Thyroid: No thyromegaly.  Cardiovascular:     Rate and Rhythm: Normal rate and regular rhythm.     Heart sounds: Normal heart sounds. No murmur heard. Pulmonary:     Effort: Pulmonary effort is normal. No respiratory distress.     Breath sounds: Normal breath sounds. No wheezing.   Abdominal:     General: Bowel sounds are normal. There is no distension.     Palpations: Abdomen is soft.     Tenderness: There is no abdominal tenderness.  Musculoskeletal:        General: No tenderness. Normal range of motion.     Cervical back: Normal range of motion and neck supple.  Skin:    General: Skin is warm and dry.  Neurological:     Mental Status: She is alert and oriented to person, place, and time.     Cranial Nerves: No cranial nerve deficit.     Deep Tendon Reflexes: Reflexes are normal and symmetric.  Psychiatric:        Behavior: Behavior normal.        Thought Content: Thought content normal.        Judgment: Judgment normal.     BP 130/87   Pulse 86   Temp 97.8 F (36.6 C) (Temporal)   Ht 5\' 9"  (1.753 m)   Wt 198 lb 6.4 oz (90 kg)   SpO2 97%   BMI 29.30 kg/m        Assessment & Plan:   Tami Johnson comes in today with chief complaint of Sore Throat   Diagnosis and orders addressed:  1. Sore throat - amoxicillin (AMOXIL) 500 MG capsule; Take 1 capsule (500  mg total) by mouth 2 (two) times daily for 10 days.  Dispense: 20 capsule; Refill: 0 - Rapid Strep Screen ([facility] ONLY)  2. Strep throat - Take meds as prescribed - Use a cool mist humidifier  -Use saline nose sprays frequently -Force fluids -For any cough or congestion  Use plain Mucinex- regular strength or max strength is fine -For fever or aces or pains- take tylenol or ibuprofen. -Throat lozenges if help -New toothbrush in 3 days Follow up if symptoms worsen or do not improve  - amoxicillin (AMOXIL) 500 MG capsule; Take 1 capsule (500 mg total) by mouth 2 (two) times daily for 10 days.  Dispense: 20 capsule; Refill: 0  Evelina Dun, FNP

## 2022-07-15 NOTE — Patient Instructions (Signed)

## 2022-07-18 ENCOUNTER — Ambulatory Visit (AMBULATORY_SURGERY_CENTER): Payer: Self-pay

## 2022-07-18 VITALS — Ht 69.0 in | Wt 196.0 lb

## 2022-07-18 DIAGNOSIS — Z1211 Encounter for screening for malignant neoplasm of colon: Secondary | ICD-10-CM

## 2022-07-18 MED ORDER — NA SULFATE-K SULFATE-MG SULF 17.5-3.13-1.6 GM/177ML PO SOLN: 1.0000 | Freq: Once | ORAL | 0 refills | Status: AC

## 2022-07-18 NOTE — Progress Notes (Signed)

## 2022-07-21 ENCOUNTER — Encounter: Payer: Self-pay | Admitting: Certified Registered Nurse Anesthetist

## 2022-07-25 ENCOUNTER — Encounter: Payer: Self-pay | Admitting: Gastroenterology

## 2022-07-25 ENCOUNTER — Telehealth: Payer: Self-pay | Admitting: Gastroenterology

## 2022-07-25 NOTE — Telephone Encounter (Signed)
Inbound call from patient inquiring if she can put Tami Johnson in her prep and also if she can eat oatmeal. Please advise.  Thank you

## 2022-07-25 NOTE — Telephone Encounter (Signed)
Returned patient call and reviewed prep instructions and dietary restrictions. Oatmeal ok until 2 days before.

## 2022-07-29 ENCOUNTER — Encounter: Payer: Self-pay | Admitting: Gastroenterology

## 2022-07-29 ENCOUNTER — Ambulatory Visit (AMBULATORY_SURGERY_CENTER): Payer: 59 | Admitting: Gastroenterology

## 2022-07-29 VITALS — BP 120/74 | HR 45 | Temp 99.1°F | Resp 15 | Ht 66.0 in | Wt 196.0 lb

## 2022-07-29 DIAGNOSIS — Z1211 Encounter for screening for malignant neoplasm of colon: Secondary | ICD-10-CM | POA: Diagnosis not present

## 2022-07-29 MED ORDER — SODIUM CHLORIDE 0.9 % IV SOLN: 500.0000 mL | Freq: Once | INTRAVENOUS | Status: DC

## 2022-07-29 NOTE — Progress Notes (Signed)
GASTROENTEROLOGY PROCEDURE H&P NOTE   Primary Care Physician: Raliegh Ip, DO  HPI: Tami Johnson is a 46 y.o. female who presents for Colonoscopy for screening.  Past Medical History:  Diagnosis Date   Allergy    HTN (hypertension)    Past Surgical History:  Procedure Laterality Date   FINGER SURGERY  06/10/15   Current Outpatient Medications  Medication Sig Dispense Refill   fluticasone (FLONASE) 50 MCG/ACT nasal spray Place 2 sprays into both nostrils daily. 16 g 3   hydrochlorothiazide (HYDRODIURIL) 25 MG tablet Take 1 tablet (25 mg total) by mouth daily. 90 tablet 3   levocetirizine (XYZAL) 5 MG tablet TAKE 1 TABLET BY MOUTH ONCE EVERY EVENING For allergies 90 tablet 3   Magnesium 200 MG TABS Take 1 tablet by mouth daily.     metoprolol succinate (TOPROL-XL) 25 MG 24 hr tablet Take 0.5 tablets (12.5 mg total) by mouth daily. 45 tablet 3   pantoprazole (PROTONIX) 40 MG tablet TAKE 1 TABLET BY MOUTH EVERY DAY (40 MG TOTAL) for acid reflux 90 tablet 3   Current Facility-Administered Medications  Medication Dose Route Frequency Provider Last Rate Last Admin   0.9 %  sodium chloride infusion  500 mL Intravenous Once Mansouraty, Netty Starring., MD        Current Outpatient Medications:    fluticasone (FLONASE) 50 MCG/ACT nasal spray, Place 2 sprays into both nostrils daily., Disp: 16 g, Rfl: 3   hydrochlorothiazide (HYDRODIURIL) 25 MG tablet, Take 1 tablet (25 mg total) by mouth daily., Disp: 90 tablet, Rfl: 3   levocetirizine (XYZAL) 5 MG tablet, TAKE 1 TABLET BY MOUTH ONCE EVERY EVENING For allergies, Disp: 90 tablet, Rfl: 3   Magnesium 200 MG TABS, Take 1 tablet by mouth daily., Disp: , Rfl:    metoprolol succinate (TOPROL-XL) 25 MG 24 hr tablet, Take 0.5 tablets (12.5 mg total) by mouth daily., Disp: 45 tablet, Rfl: 3   pantoprazole (PROTONIX) 40 MG tablet, TAKE 1 TABLET BY MOUTH EVERY DAY (40 MG TOTAL) for acid reflux, Disp: 90 tablet, Rfl: 3  Current  Facility-Administered Medications:    0.9 %  sodium chloride infusion, 500 mL, Intravenous, Once, Mansouraty, Netty Starring., MD Allergies  Allergen Reactions   Ciprofloxacin Hives   Sulfa Antibiotics Hives and Other (See Comments)   Sulfamethoxazole-Trimethoprim Other (See Comments)   Hydrocodone-Acetaminophen Other (See Comments)   Family History  Problem Relation Age of Onset   Colon polyps Mother    Colon polyps Father    Hypertension Father    Heart attack Neg Hx    Stroke Neg Hx    Colon cancer Neg Hx    Esophageal cancer Neg Hx    Rectal cancer Neg Hx    Stomach cancer Neg Hx    Social History   Socioeconomic History   Marital status: Married    Spouse name: Not on file   Number of children: Not on file   Years of education: Not on file   Highest education level: Not on file  Occupational History   Not on file  Tobacco Use   Smoking status: Never   Smokeless tobacco: Never  Vaping Use   Vaping Use: Never used  Substance and Sexual Activity   Alcohol use: No   Drug use: No   Sexual activity: Yes    Birth control/protection: None  Other Topics Concern   Not on file  Social History Narrative   Not on file   Social Determinants  of Health   Financial Resource Strain: Not on file  Food Insecurity: Not on file  Transportation Needs: Not on file  Physical Activity: Not on file  Stress: Not on file  Social Connections: Not on file  Intimate Partner Violence: Not on file    Physical Exam: Today's Vitals   07/29/22 0711 07/29/22 0712  BP: 116/67   Pulse: 75   Temp: (!) 97.5 F (36.4 C) 99.1 F (37.3 C)  TempSrc: Skin   Weight: 196 lb (88.9 kg)   Height: 5\' 6"  (1.676 m)    Body mass index is 31.64 kg/m. GEN: NAD EYE: Sclerae anicteric ENT: MMM CV: Non-tachycardic GI: Soft, NT/ND NEURO:  Alert & Oriented x 3  Lab Results: No results for input(s): "WBC", "HGB", "HCT", "PLT" in the last 72 hours. BMET No results for input(s): "NA", "K", "CL",  "CO2", "GLUCOSE", "BUN", "CREATININE", "CALCIUM" in the last 72 hours. LFT No results for input(s): "PROT", "ALBUMIN", "AST", "ALT", "ALKPHOS", "BILITOT", "BILIDIR", "IBILI" in the last 72 hours. PT/INR No results for input(s): "LABPROT", "INR" in the last 72 hours.   Impression / Plan: This is a 46 y.o.female who presents for Colonoscopy for screening.  The risks and benefits of endoscopic evaluation/treatment were discussed with the patient and/or family; these include but are not limited to the risk of perforation, infection, bleeding, missed lesions, lack of diagnosis, severe illness requiring hospitalization, as well as anesthesia and sedation related illnesses.  The patient's history has been reviewed, patient examined, no change in status, and deemed stable for procedure.  The patient and/or family is agreeable to proceed.    Justice Britain, MD [facility] Office # [phone]

## 2022-07-29 NOTE — Patient Instructions (Signed)
Follow a high fiber diet, use fibercon 1-2 tablets by mouth daily. Resume previous diet and medications. Repeat Colonoscopy in 10 years for surveillance. Handouts provided on diverticulosis and hemorrhoids.   YOU HAD AN ENDOSCOPIC PROCEDURE TODAY AT [facility]:   Refer to the procedure report that was given to you for any specific questions about what was found during the examination.  If the procedure report does not answer your questions, please call your gastroenterologist to clarify.  If you requested that your care partner not be given the details of your procedure findings, then the procedure report has been included in a sealed envelope for you to review at your convenience later.  YOU SHOULD EXPECT: Some feelings of bloating in the abdomen. Passage of more gas than usual.  Walking can help get rid of the air that was put into your GI tract during the procedure and reduce the bloating. If you had a lower endoscopy (such as a colonoscopy or flexible sigmoidoscopy) you may notice spotting of blood in your stool or on the toilet paper. If you underwent a bowel prep for your procedure, you may not have a normal bowel movement for a few days.  Please Note:  You might notice some irritation and congestion in your nose or some drainage.  This is from the oxygen used during your procedure.  There is no need for concern and it should clear up in a day or so.  SYMPTOMS TO REPORT IMMEDIATELY:  Following lower endoscopy (colonoscopy or flexible sigmoidoscopy):  Excessive amounts of blood in the stool  Significant tenderness or worsening of abdominal pains  Swelling of the abdomen that is new, acute  Fever of 100F or higher   For urgent or emergent issues, a gastroenterologist can be reached at any hour by calling [phone]. Do not use MyChart messaging for urgent concerns.    DIET:  We do recommend a small meal at first, but then you may proceed to your regular diet.  Drink  plenty of fluids but you should avoid alcoholic beverages for 24 hours.  ACTIVITY:  You should plan to take it easy for the rest of today and you should NOT DRIVE or use heavy machinery until tomorrow (because of the sedation medicines used during the test).    FOLLOW UP: Our staff will call the number listed on your records the next business day following your procedure.  We will call around 7:15- 8:00 am to check on you and address any questions or concerns that you may have regarding the information given to you following your procedure. If we do not reach you, we will leave a message.     If any biopsies were taken you will be contacted by phone or by letter within the next 1-3 weeks.  Please call us at [phone] if you have not heard about the biopsies in 3 weeks.    SIGNATURES/CONFIDENTIALITY: You and/or your care partner have signed paperwork which will be entered into your electronic medical record.  These signatures attest to the fact that that the information above on your After Visit Summary has been reviewed and is understood.  Full responsibility of the confidentiality of this discharge information lies with you and/or your care-partner.

## 2022-07-29 NOTE — Op Note (Addendum)
[place] Patient Name: Tami Johnson Procedure Date: 07/29/2022 8:10 AM MRN: [id number] Endoscopist: Justice Britain , MD, [id number] Age: 46 Referring MD:  Date of Birth: April 25, 1976 Gender: Female Account #: [id number] Procedure:                Colonoscopy Indications:              Screening for colorectal malignant neoplasm, This                            is the patient's first colonoscopy Medicines:                Monitored Anesthesia Care Procedure:                Pre-Anesthesia Assessment:                           - Prior to the procedure, a History and Physical                            was performed, and patient medications and                            allergies were reviewed. The patient's tolerance of                            previous anesthesia was also reviewed. The risks                            and benefits of the procedure and the sedation                            options and risks were discussed with the patient.                            All questions were answered, and informed consent                            was obtained. Prior Anticoagulants: The patient has                            taken no anticoagulant or antiplatelet agents. ASA                            Grade Assessment: II - A patient with mild systemic                            disease. After reviewing the risks and benefits,                            the patient was deemed in satisfactory condition to                            undergo the procedure.  After obtaining informed consent, the colonoscope                            was passed under direct vision. Throughout the                            procedure, the patient's blood pressure, pulse, and                            oxygen saturations were monitored continuously. The                            Olympus CF-HQ190L ([id number]) Colonoscope was                            introduced through  the anus and advanced to the the                            cecum, identified by palpation. The colonoscopy was                            somewhat difficult due to significant looping.                            Successful completion of the procedure was aided by                            changing the patient's position, withdrawing and                            reinserting the scope, straightening and shortening                            the scope to obtain bowel loop reduction and using                            scope torsion. The patient tolerated the procedure.                            The quality of the bowel preparation was good. The                            ileocecal valve, appendiceal orifice, and rectum                            were photographed. Scope In: 8:15:21 AM Scope Out: 8:28:47 AM Scope Withdrawal Time: 0 hours 9 minutes 45 seconds  Total Procedure Duration: 0 hours 13 minutes 26 seconds  Findings:                 The digital rectal exam findings include                            hemorrhoids. Pertinent negatives include no  palpable rectal lesions.                           The colon (entire examined portion) was                            significantly redundant leading to moderate looping.                           Many small-mouthed diverticula were found in the                            entire colon.                           Normal mucosa was found in the entire colon.                           Anal papillae were hypertrophied.                           Non-bleeding non-thrombosed external and internal                            hemorrhoids were found during retroflexion, during                            perianal exam and during digital exam. The                            hemorrhoids were Grade II (internal hemorrhoids                            that prolapse but reduce spontaneously). Complications:            No immediate  complications. Estimated Blood Loss:     Estimated blood loss was minimal. Impression:               - Hemorrhoids found on digital rectal exam.                           - Redundant colon leading to moderate looping.                           - Diverticulosis in the entire examined colon.                           - Normal mucosa in the entire examined colon                            otherwise.                           - Anal papillae were hypertrophied.                           - Non-bleeding non-thrombosed external and internal  hemorrhoids. Recommendation:           - The patient will be observed post-procedure,                            until all discharge criteria are met.                           - Discharge patient to home.                           - Patient has a contact number available for                            emergencies. The signs and symptoms of potential                            delayed complications were discussed with the                            patient. Return to normal activities tomorrow.                            Written discharge instructions were provided to the                            patient.                           - High fiber diet.                           - Use FiberCon 1-2 tablets PO daily.                           - Continue present medications.                           - Await pathology results.                           - Repeat colonoscopy in 10 years for screening                            purposes.                           - The findings and recommendations were discussed                            with the patient.                           - The findings and recommendations were discussed                            with the patient's family. Justice Britain, MD 07/29/2022 8:34:41 AM

## 2022-07-29 NOTE — Progress Notes (Signed)
Report given to PACU, vss 

## 2022-07-29 NOTE — Progress Notes (Signed)
Pt's states no medical or surgical changes since previsit or office visit. 

## 2022-08-01 ENCOUNTER — Telehealth: Payer: Self-pay | Admitting: *Deleted

## 2022-08-01 NOTE — Telephone Encounter (Signed)
  Follow up Call-     07/29/2022    7:12 AM  Call back number  Post procedure Call Back phone  # [phone]  Permission to leave phone message Yes     Patient questions:  Do you have a fever, pain , or abdominal swelling? No. Pain Score  0 *  Have you tolerated food without any problems? Yes.    Have you been able to return to your normal activities? Yes.    Do you have any questions about your discharge instructions: Diet   No. Medications  No. Follow up visit  No.  Do you have questions or concerns about your Care? No.  Actions: * If pain score is 4 or above: No action needed, pain <4.

## 2022-08-02 ENCOUNTER — Ambulatory Visit: Payer: No Typology Code available for payment source | Admitting: Nurse Practitioner

## 2022-08-03 ENCOUNTER — Telehealth: Payer: Self-pay | Admitting: Gastroenterology

## 2022-08-03 NOTE — Telephone Encounter (Signed)
Left message on machine to call back  

## 2022-08-03 NOTE — Telephone Encounter (Signed)
Inbound call from patient stating she has had a smooth bowl movement for the last two days but she has had blood in her stool. Please advise.  Thank you

## 2022-08-03 NOTE — Telephone Encounter (Signed)
The pt had colon on 11/3- hemorrhoids seen.  She states that she had a soft BM this morning that had some BRB mixed in.  NO further blood seen and no other symptoms.  I have advised the pt to monitor and call back if the bleeding does not resolve or if it becomes heavy.  The pt has been advised of the information and verbalized understanding.    Any further req's.

## 2022-08-04 NOTE — Telephone Encounter (Signed)
Agree with plan of action, based on recent full colonoscopy, likely hemorrhoidal. If issues persist, then can consider Anusol suppositories.

## 2022-08-08 ENCOUNTER — Other Ambulatory Visit: Payer: Self-pay | Admitting: Obstetrics and Gynecology

## 2022-08-08 DIAGNOSIS — Z1231 Encounter for screening mammogram for malignant neoplasm of breast: Secondary | ICD-10-CM

## 2022-09-06 ENCOUNTER — Encounter: Payer: Self-pay | Admitting: Family Medicine

## 2022-09-06 ENCOUNTER — Ambulatory Visit (INDEPENDENT_AMBULATORY_CARE_PROVIDER_SITE_OTHER): Payer: No Typology Code available for payment source | Admitting: Family Medicine

## 2022-09-06 VITALS — BP 136/89 | HR 73 | Temp 98.4°F | Ht 66.0 in | Wt 194.4 lb

## 2022-09-06 DIAGNOSIS — J0140 Acute pansinusitis, unspecified: Secondary | ICD-10-CM | POA: Diagnosis not present

## 2022-09-06 MED ORDER — AMOXICILLIN-POT CLAVULANATE 875-125 MG PO TABS: 1.0000 | ORAL_TABLET | Freq: Two times a day (BID) | ORAL | 0 refills | Status: AC

## 2022-09-06 MED ORDER — PREDNISONE 20 MG PO TABS: 40.0000 mg | ORAL_TABLET | Freq: Every day | ORAL | 0 refills | Status: AC

## 2022-09-06 NOTE — Progress Notes (Signed)
Subjective:  Patient ID: Tami Johnson, female    DOB: [birth year], 46 y.o.   MRN: [id number]  Patient Care Team: Raliegh Ip, DO as PCP - General (Family Medicine) Meisinger, Tawanna Cooler, MD as Consulting Physician (Obstetrics and Gynecology)   Chief Complaint:  Cough, chest congestion , Headache, and Chills (X 6 days )   HPI: Tami Johnson is a 46 y.o. female presenting on 09/06/2022 for Cough, chest congestion , Headache, and Chills (X 6 days )   Pt presents today for cough, congestion, sinus pressure, fever, and chills for over a week. Has been taking Mucinex and using Flonase at home without relief of symptoms.   Cough This is a new problem. The current episode started in the past 7 days. The problem has been waxing and waning. The problem occurs constantly. The cough is Productive of sputum. Associated symptoms include chills, ear congestion, ear pain, a fever, headaches, nasal congestion, postnasal drip and rhinorrhea. Pertinent negatives include no chest pain, eye redness, heartburn, hemoptysis, myalgias, rash, sore throat, shortness of breath, sweats, weight loss or wheezing. Nothing aggravates the symptoms. She has tried OTC cough suppressant for the symptoms. The treatment provided no relief.  Headache  This is a new problem. The current episode started in the past 7 days. The problem occurs constantly. The problem has been gradually worsening. The pain is located in the Frontal region. The quality of the pain is described as aching. The pain is moderate. Associated symptoms include coughing, drainage, ear pain, a fever, rhinorrhea and sinus pressure. Pertinent negatives include no abdominal pain, abnormal behavior, anorexia, back pain, blurred vision, dizziness, eye pain, eye redness, eye watering, facial sweating, hearing loss, insomnia, loss of balance, muscle aches, nausea, neck pain, numbness, phonophobia, photophobia, scalp tenderness, seizures, sore throat, swollen  glands, tingling, tinnitus, visual change, vomiting, weakness or weight loss. Nothing aggravates the symptoms. She has tried NSAIDs for the symptoms. The treatment provided no relief.     Relevant past medical, surgical, family, and social history reviewed and updated as indicated.  Allergies and medications reviewed and updated. Data reviewed: Chart in Epic.   Past Medical History:  Diagnosis Date   Allergy    HTN (hypertension)     Past Surgical History:  Procedure Laterality Date   FINGER SURGERY  06/10/15    Social History   Socioeconomic History   Marital status: Married    Spouse name: Not on file   Number of children: Not on file   Years of education: Not on file   Highest education level: Not on file  Occupational History   Not on file  Tobacco Use   Smoking status: Never   Smokeless tobacco: Never  Vaping Use   Vaping Use: Never used  Substance and Sexual Activity   Alcohol use: No   Drug use: No   Sexual activity: Yes    Birth control/protection: None  Other Topics Concern   Not on file  Social History Narrative   Not on file   Social Determinants of Health   Financial Resource Strain: Not on file  Food Insecurity: Not on file  Transportation Needs: Not on file  Physical Activity: Not on file  Stress: Not on file  Social Connections: Not on file  Intimate Partner Violence: Not on file    Outpatient Encounter Medications as of 09/06/2022  Medication Sig   amoxicillin-clavulanate (AUGMENTIN) 875-125 MG tablet Take 1 tablet by mouth 2 (two) times daily for 10  days.   fluticasone (FLONASE) 50 MCG/ACT nasal spray Place 2 sprays into both nostrils daily.   hydrochlorothiazide (HYDRODIURIL) 25 MG tablet Take 1 tablet (25 mg total) by mouth daily.   levocetirizine (XYZAL) 5 MG tablet TAKE 1 TABLET BY MOUTH ONCE EVERY EVENING For allergies   Magnesium 200 MG TABS Take 1 tablet by mouth daily.   metoprolol succinate (TOPROL-XL) 25 MG 24 hr tablet Take 0.5  tablets (12.5 mg total) by mouth daily.   pantoprazole (PROTONIX) 40 MG tablet TAKE 1 TABLET BY MOUTH EVERY DAY (40 MG TOTAL) for acid reflux   predniSONE (DELTASONE) 20 MG tablet Take 2 tablets (40 mg total) by mouth daily with breakfast for 5 days.   No facility-administered encounter medications on file as of 09/06/2022.    Allergies  Allergen Reactions   Ciprofloxacin Hives   Sulfa Antibiotics Hives, Other (See Comments) and Itching   Sulfamethoxazole-Trimethoprim Other (See Comments), Itching and Rash   Hydrocodone-Acetaminophen Other (See Comments) and Cough    Review of Systems  Constitutional:  Positive for chills, fatigue and fever. Negative for activity change, appetite change, diaphoresis, unexpected weight change and weight loss.  HENT:  Positive for congestion, ear pain, postnasal drip, rhinorrhea, sinus pressure and sinus pain. Negative for dental problem, drooling, ear discharge, facial swelling, hearing loss, mouth sores, nosebleeds, sneezing, sore throat, tinnitus, trouble swallowing and voice change.   Eyes:  Negative for blurred vision, photophobia, pain and redness.  Respiratory:  Positive for cough. Negative for apnea, hemoptysis, choking, chest tightness, shortness of breath, wheezing and stridor.   Cardiovascular:  Negative for chest pain, palpitations and leg swelling.  Gastrointestinal:  Negative for abdominal distention, abdominal pain, anal bleeding, anorexia, blood in stool, constipation, diarrhea, heartburn, nausea, rectal pain and vomiting.  Genitourinary:  Negative for decreased urine volume and difficulty urinating.  Musculoskeletal:  Negative for arthralgias, back pain, myalgias and neck pain.  Skin:  Negative for rash.  Neurological:  Positive for headaches. Negative for dizziness, tingling, tremors, seizures, syncope, facial asymmetry, speech difficulty, weakness, light-headedness, numbness and loss of balance.  Psychiatric/Behavioral:  Negative for  confusion. The patient does not have insomnia.   All other systems reviewed and are negative.       Objective:  BP 136/89   Pulse 73   Temp 98.4 F (36.9 C) (Temporal)   Ht 5\' 6"  (1.676 m)   Wt 194 lb 6.4 oz (88.2 kg)   SpO2 96%   BMI 31.38 kg/m    Wt Readings from Last 3 Encounters:  09/06/22 194 lb 6.4 oz (88.2 kg)  07/29/22 196 lb (88.9 kg)  07/18/22 196 lb (88.9 kg)    Physical Exam Vitals and nursing note reviewed.  Constitutional:      General: She is not in acute distress.    Appearance: Normal appearance. She is well-developed and well-groomed. She is obese. She is not ill-appearing, toxic-appearing or diaphoretic.  HENT:     Head: Normocephalic and atraumatic.     Jaw: There is normal jaw occlusion.     Right Ear: Hearing, ear canal and external ear normal. A middle ear effusion is present. Tympanic membrane is not erythematous.     Left Ear: Hearing, ear canal and external ear normal. A middle ear effusion is present. Tympanic membrane is not erythematous.     Nose: Congestion and rhinorrhea present.     Right Turbinates: Enlarged.     Left Turbinates: Enlarged.     Right Sinus: Maxillary sinus tenderness  and frontal sinus tenderness present.     Left Sinus: Maxillary sinus tenderness and frontal sinus tenderness present.     Mouth/Throat:     Lips: Pink.     Mouth: Mucous membranes are moist.     Pharynx: Oropharynx is clear. Uvula midline. Posterior oropharyngeal erythema present. No pharyngeal swelling, oropharyngeal exudate or uvula swelling.     Tonsils: No tonsillar exudate or tonsillar abscesses.  Eyes:     General: Lids are normal.     Extraocular Movements: Extraocular movements intact.     Conjunctiva/sclera: Conjunctivae normal.     Pupils: Pupils are equal, round, and reactive to light.  Neck:     Thyroid: No thyroid mass, thyromegaly or thyroid tenderness.     Vascular: No carotid bruit or JVD.     Trachea: Trachea and phonation normal.   Cardiovascular:     Rate and Rhythm: Normal rate and regular rhythm.     Chest Wall: PMI is not displaced.     Pulses: Normal pulses.     Heart sounds: Normal heart sounds. No murmur heard.    No friction rub. No gallop.  Pulmonary:     Effort: Pulmonary effort is normal. No respiratory distress.     Breath sounds: Normal breath sounds. No wheezing.  Abdominal:     General: Bowel sounds are normal. There is no distension or abdominal bruit.     Palpations: Abdomen is soft. There is no hepatomegaly or splenomegaly.     Tenderness: There is no abdominal tenderness. There is no right CVA tenderness or left CVA tenderness.     Hernia: No hernia is present.  Musculoskeletal:        General: Normal range of motion.     Cervical back: Normal range of motion and neck supple.     Right lower leg: No edema.     Left lower leg: No edema.  Lymphadenopathy:     Cervical: No cervical adenopathy.  Skin:    General: Skin is warm and dry.     Capillary Refill: Capillary refill takes less than 2 seconds.     Coloration: Skin is not cyanotic, jaundiced or pale.     Findings: No rash.  Neurological:     General: No focal deficit present.     Mental Status: She is alert and oriented to person, place, and time.     Cranial Nerves: No cranial nerve deficit.     Sensory: Sensation is intact. No sensory deficit.     Motor: Motor function is intact. No weakness.     Coordination: Coordination is intact. Coordination normal.     Gait: Gait is intact. Gait normal.     Deep Tendon Reflexes: Reflexes are normal and symmetric. Reflexes normal.  Psychiatric:        Attention and Perception: Attention and perception normal.        Mood and Affect: Mood and affect normal.        Speech: Speech normal.        Behavior: Behavior normal. Behavior is cooperative.        Thought Content: Thought content normal.        Cognition and Memory: Cognition and memory normal.        Judgment: Judgment normal.      Results for orders placed or performed in visit on 07/15/22  Rapid Strep Screen ([facility] ONLY)   Specimen: Other   Other  Result Value Ref Range   Strep  Gp A Ag, IA W/Reflex Positive (A) Negative       Pertinent labs & imaging results that were available during my care of the patient were reviewed by me and considered in my medical decision making.  Assessment & Plan:  Tami Johnson was seen today for cough, chest congestion , headache and chills.  Diagnoses and all orders for this visit:  Acute non-recurrent pansinusitis Has tried and failed symptomatic care at home. Aware to continue. Will add prednisone and Augmentin to regimen. Report new, worsening, or persistent symptoms.  -     predniSONE (DELTASONE) 20 MG tablet; Take 2 tablets (40 mg total) by mouth daily with breakfast for 5 days. -     amoxicillin-clavulanate (AUGMENTIN) 875-125 MG tablet; Take 1 tablet by mouth 2 (two) times daily for 10 days.     Continue all other maintenance medications.  Follow up plan: Return if symptoms worsen or fail to improve.   Continue healthy lifestyle choices, including diet (rich in fruits, vegetables, and lean proteins, and low in salt and simple carbohydrates) and exercise (at least 30 minutes of moderate physical activity daily).  Educational handout given for sinuitis   The above assessment and management plan was discussed with the patient. The patient verbalized understanding of and has agreed to the management plan. Patient is aware to call the clinic if they develop any new symptoms or if symptoms persist or worsen. Patient is aware when to return to the clinic for a follow-up visit. Patient educated on when it is appropriate to go to the emergency department.   Kari Baars, FNP-C [facility] [phone]

## 2022-09-22 ENCOUNTER — Ambulatory Visit
Admission: RE | Admit: 2022-09-22 | Discharge: 2022-09-22 | Disposition: A | Discharge status: Home / Self Care | Payer: No Typology Code available for payment source | Source: Ambulatory Visit | Attending: Obstetrics and Gynecology | Admitting: Obstetrics and Gynecology

## 2022-09-22 ENCOUNTER — Ambulatory Visit: Payer: 59

## 2022-09-22 DIAGNOSIS — Z1231 Encounter for screening mammogram for malignant neoplasm of breast: Secondary | ICD-10-CM

## 2022-09-23 ENCOUNTER — Other Ambulatory Visit: Payer: Self-pay | Admitting: Obstetrics and Gynecology

## 2022-09-23 DIAGNOSIS — R928 Other abnormal and inconclusive findings on diagnostic imaging of breast: Secondary | ICD-10-CM

## 2022-10-04 ENCOUNTER — Ambulatory Visit
Admission: RE | Admit: 2022-10-04 | Discharge: 2022-10-04 | Disposition: A | Discharge status: Home / Self Care | Payer: No Typology Code available for payment source | Source: Ambulatory Visit | Attending: Obstetrics and Gynecology | Admitting: Obstetrics and Gynecology

## 2022-10-04 DIAGNOSIS — R928 Other abnormal and inconclusive findings on diagnostic imaging of breast: Secondary | ICD-10-CM

## 2022-10-19 ENCOUNTER — Encounter: Payer: Self-pay | Admitting: Family Medicine

## 2022-10-19 ENCOUNTER — Ambulatory Visit (INDEPENDENT_AMBULATORY_CARE_PROVIDER_SITE_OTHER): Payer: No Typology Code available for payment source | Admitting: Family Medicine

## 2022-10-19 VITALS — BP 129/69 | HR 71 | Temp 98.2°F | Ht 66.0 in | Wt 197.0 lb

## 2022-10-19 DIAGNOSIS — G43909 Migraine, unspecified, not intractable, without status migrainosus: Secondary | ICD-10-CM

## 2022-10-19 DIAGNOSIS — J324 Chronic pansinusitis: Secondary | ICD-10-CM

## 2022-10-19 MED ORDER — METHYLPREDNISOLONE ACETATE 80 MG/ML IJ SUSP
80.0000 mg | Freq: Once | INTRAMUSCULAR | Status: AC
  Administered 2022-10-19: 80 mg via INTRAMUSCULAR

## 2022-10-19 MED ORDER — KETOROLAC TROMETHAMINE 60 MG/2ML IM SOLN
60.0000 mg | Freq: Once | INTRAMUSCULAR | Status: AC
  Administered 2022-10-19: 60 mg via INTRAMUSCULAR

## 2022-10-19 NOTE — Patient Instructions (Signed)
Migraine Headache A migraine headache is an intense, throbbing pain on one side or both sides of the head. Migraine headaches may also cause other symptoms, such as nausea, vomiting, and sensitivity to light and noise. A migraine headache can last from 4 hours to 3 days. Talk with your doctor about what things may bring on (trigger) your migraine headaches. What are the causes? The exact cause of this condition is not known. However, a migraine may be caused when nerves in the brain become irritated and release chemicals that cause inflammation of blood vessels. This inflammation causes pain. This condition may be triggered or caused by: Drinking alcohol. Smoking. Taking medicines, such as: Medicine used to treat chest pain (nitroglycerin). Birth control pills. Estrogen. Certain blood pressure medicines. Eating or drinking products that contain nitrates, glutamate, aspartame, or tyramine. Aged cheeses, chocolate, or caffeine may also be triggers. Doing physical activity. Other things that may trigger a migraine headache include: Menstruation. Pregnancy. Hunger. Stress. Lack of sleep or too much sleep. Weather changes. Fatigue. What increases the risk? The following factors may make you more likely to experience migraine headaches: Being a certain age. This condition is more common in people who are 46 years old. Being female. Having a family history of migraine headaches. Being Caucasian. Having a mental health condition, such as depression or anxiety. Being obese. What are the signs or symptoms? The main symptom of this condition is pulsating or throbbing pain. This pain may: Happen in any area of the head, such as on one side or both sides. Interfere with daily activities. Get worse with physical activity. Get worse with exposure to bright lights or loud noises. Other symptoms may include: Nausea. Vomiting. Dizziness. General sensitivity to bright lights, loud noises, or  smells. Before you get a migraine headache, you may get warning signs (an aura). An aura may include: Seeing flashing lights or having blind spots. Seeing bright spots, halos, or zigzag lines. Having tunnel vision or blurred vision. Having numbness or a tingling feeling. Having trouble talking. Having muscle weakness. Some people have symptoms after a migraine headache (postdromal phase), such as: Feeling tired. Difficulty concentrating. How is this diagnosed? A migraine headache can be diagnosed based on: Your symptoms. A physical exam. Tests, such as: CT scan or an MRI of the head. These imaging tests can help rule out other causes of headaches. Taking fluid from the spine (lumbar puncture) and analyzing it (cerebrospinal fluid analysis, or CSF analysis). How is this treated? This condition may be treated with medicines that: Relieve pain. Relieve nausea. Prevent migraine headaches. Treatment for this condition may also include: Acupuncture. Lifestyle changes like avoiding foods that trigger migraine headaches. Biofeedback. Cognitive behavioral therapy. Follow these instructions at home: Medicines Take over-the-counter and prescription medicines only as told by your health care provider. Ask your health care provider if the medicine prescribed to you: Requires you to avoid driving or using heavy machinery. Can cause constipation. You may need to take these actions to prevent or treat constipation: Drink enough fluid to keep your urine pale yellow. Take over-the-counter or prescription medicines. Eat foods that are high in fiber, such as beans, whole grains, and fresh fruits and vegetables. Limit foods that are high in fat and processed sugars, such as fried or sweet foods. Lifestyle Do not drink alcohol. Do not use any products that contain nicotine or tobacco, such as cigarettes, e-cigarettes, and chewing tobacco. If you need help quitting, ask your health care  provider. Get at least 8  hours of sleep every night. Find ways to manage stress, such as meditation, deep breathing, or yoga. General instructions Keep a journal to find out what may trigger your migraine headaches. For example, write down: What you eat and drink. How much sleep you get. Any change to your diet or medicines. If you have a migraine headache: Avoid things that make your symptoms worse, such as bright lights. It may help to lie down in a dark, quiet room. Do not drive or use heavy machinery. Ask your health care provider what activities are safe for you while you are experiencing symptoms. Keep all follow-up visits as told by your health care provider. This is important. Contact a health care provider if: You develop symptoms that are different or more severe than your usual migraine headache symptoms. You have more than 15 headache days in one month. Get help right away if: Your migraine headache becomes severe. Your migraine headache lasts longer than 72 hours. You have a fever. You have a stiff neck. You have vision loss. Your muscles feel weak or like you cannot control them. You start to lose your balance often. You have trouble walking. You faint. You have a seizure. Summary A migraine headache is an intense, throbbing pain on one side or both sides of the head. Migraines may also cause other symptoms, such as nausea, vomiting, and sensitivity to light and noise. This condition may be treated with medicines and lifestyle changes. You may also need to avoid certain things that trigger a migraine headache. Keep a journal to find out what may trigger your migraine headaches. Contact your health care provider if you have more than 15 headache days in a month or you develop symptoms that are different or more severe than your usual migraine headache symptoms. This information is not intended to replace advice given to you by your health care provider. Make sure you  discuss any questions you have with your health care provider. Document Revised: 02/24/2022 Document Reviewed: 10/25/2018 Elsevier Patient Education  2023 [employer]  

## 2022-10-19 NOTE — Progress Notes (Signed)
Acute Office Visit  Subjective:     Patient ID: Tami Johnson, female    DOB: Sep 15, 1976, 47 y.o.   MRN: [id number]  Chief Complaint  Patient presents with   Facial Pain    HPI Patient is in today for facial pain and HA around her right eye for 2 days. Denies erythema, eye pain, or eye drainage. She has a history of chronic sinusitis and recurrent bacterial sinusitis. She reports that nasal congestion is baseline for her. Denies fever, cough, sore throat, shortness of breath, or chest pain. She also has a history of migraines. She does reports sensitivity to light. Denies nasuea or vomiting. Rest improves symtpoms, activity worsens HA. Dark room also improves symptoms. Denies changes in vision, focal weakness, or dizziness. She has tried excedrin migraine with mild relief. She takes xyzal and flonase daily for chronic allergies.    ROS As per HPI.      Objective:    BP 129/69   Pulse 71   Temp 98.2 F (36.8 C) (Temporal)   Ht 5\' 6"  (1.676 m)   Wt 197 lb (89.4 kg)   SpO2 96%   BMI 31.80 kg/m    Physical Exam Vitals and nursing note reviewed.  Constitutional:      Appearance: She is not toxic-appearing or diaphoretic.  HENT:     Right Ear: Tympanic membrane, ear canal and external ear normal.     Left Ear: Tympanic membrane, ear canal and external ear normal.     Nose: Congestion present.     Right Sinus: Maxillary sinus tenderness and frontal sinus tenderness present.     Left Sinus: No maxillary sinus tenderness or frontal sinus tenderness.     Mouth/Throat:     Mouth: Mucous membranes are moist.     Pharynx: Oropharynx is clear.  Eyes:     General:        Right eye: No discharge.        Left eye: No discharge.     Extraocular Movements: Extraocular movements intact.     Conjunctiva/sclera: Conjunctivae normal.     Pupils: Pupils are equal, round, and reactive to light.  Cardiovascular:     Rate and Rhythm: Normal rate and regular rhythm.     Heart  sounds: Normal heart sounds. No murmur heard. Pulmonary:     Effort: Pulmonary effort is normal. No respiratory distress.     Breath sounds: Normal breath sounds. No wheezing.  Abdominal:     General: Bowel sounds are normal. There is no distension.     Palpations: Abdomen is soft.     Tenderness: There is no abdominal tenderness.  Musculoskeletal:     Cervical back: Neck supple. No rigidity.     Right lower leg: No edema.     Left lower leg: No edema.  Lymphadenopathy:     Cervical: No cervical adenopathy.  Skin:    General: Skin is warm and dry.  Neurological:     General: No focal deficit present.     Mental Status: She is alert and oriented to person, place, and time.     Motor: No weakness.     Gait: Gait normal.  Psychiatric:        Mood and Affect: Mood normal.        Behavior: Behavior normal.     No results found for any visits on 10/19/22.      Assessment & Plan:   Analise was seen today for facial pain.  Diagnoses and all orders for this visit:  Acute migraine -     methylPREDNISolone acetate (DEPO-MEDROL) injection 80 mg -     ketorolac (TORADOL) injection 60 mg  Chronic pansinusitis  Suspect acute migraine due to chronic sinusitis. Steroid IM and Toradol IM injection today in the office. Discussed prednisone burst and antibiotic for recurrent bacterial sinusitis if symptoms do not improve or worsen. Continue excedrin migraine prn, zyrtec and flonase daily. Return to office for new or worsening symptoms, or if symptoms persist.   The patient indicates understanding of these issues and agrees with the plan.   Gwenlyn Perking, FNP

## 2022-10-20 ENCOUNTER — Telehealth: Payer: Self-pay | Admitting: Family Medicine

## 2022-10-20 DIAGNOSIS — J324 Chronic pansinusitis: Secondary | ICD-10-CM

## 2022-10-20 MED ORDER — AMOXICILLIN-POT CLAVULANATE 875-125 MG PO TABS: 1.0000 | ORAL_TABLET | Freq: Two times a day (BID) | ORAL | 0 refills | Status: AC

## 2022-10-20 MED ORDER — PREDNISONE 10 MG (21) PO TBPK: ORAL_TABLET | ORAL | 0 refills | Status: DC

## 2022-10-20 NOTE — Telephone Encounter (Signed)
I have sent in prednisone pack and augmentin

## 2022-10-20 NOTE — Telephone Encounter (Signed)
Called and spoke with pt

## 2022-10-20 NOTE — Telephone Encounter (Signed)
Pt seen yesterday and still having pain on her RT eye from the headache. Pt asking if steroid medicine can be called in. Pt says that may be a sinus infection? Please call back Use [facility] in [place].

## 2023-01-03 ENCOUNTER — Encounter: Payer: Self-pay | Admitting: Family Medicine

## 2023-01-03 ENCOUNTER — Telehealth (INDEPENDENT_AMBULATORY_CARE_PROVIDER_SITE_OTHER): Payer: No Typology Code available for payment source | Admitting: Family Medicine

## 2023-01-03 DIAGNOSIS — J0101 Acute recurrent maxillary sinusitis: Secondary | ICD-10-CM | POA: Diagnosis not present

## 2023-01-03 MED ORDER — AMOXICILLIN-POT CLAVULANATE 875-125 MG PO TABS: 1.0000 | ORAL_TABLET | Freq: Two times a day (BID) | ORAL | 0 refills | Status: DC

## 2023-01-03 MED ORDER — MONTELUKAST SODIUM 10 MG PO TABS: 10.0000 mg | ORAL_TABLET | Freq: Every day | ORAL | 3 refills | Status: DC

## 2023-01-03 NOTE — Progress Notes (Signed)
MyChart Video visit  Subjective: CC:Sinusitis PCP: Raliegh Ip, DO TDS:KAJGOTL L [place] is a 47 y.o. female. Patient provides verbal consent for consult held via video.  Due to COVID-19 pandemic this visit was conducted virtually. This visit type was conducted due to national recommendations for restrictions regarding the COVID-19 Pandemic (e.g. social distancing, sheltering in place) in an effort to limit this patient's exposure and mitigate transmission in our community. All issues noted in this document were discussed and addressed.  A physical exam was not performed with this format.   Location of patient: work Location of provider: [facility] Others present for call: none  1. Sinusitis Patient reports that she started losing her voice and has been having sinusitis/ facial pain.  She reports this onset about 3-4 days when she started coughing up green.  She reports throat itching.  No sore throat.  No sick contacts, no fevers.  Her spouse has bronchitis.  She is using her Flonase and her allergy medication.  Has seen ENT previously and they told her basically that surgery may or may not help the recurrent/chronic sinusitis so she did not want to proceed with it   ROS: Per HPI  Allergies  Allergen Reactions   Ciprofloxacin Hives   Sulfa Antibiotics Hives, Other (See Comments) and Itching   Sulfamethoxazole-Trimethoprim Other (See Comments), Itching and Rash   Hydrocodone-Acetaminophen Other (See Comments) and Cough   Past Medical History:  Diagnosis Date   Allergy    HTN (hypertension)     Current Outpatient Medications:    fluticasone (FLONASE) 50 MCG/ACT nasal spray, Place 2 sprays into both nostrils daily., Disp: 16 g, Rfl: 3   hydrochlorothiazide (HYDRODIURIL) 25 MG tablet, Take 1 tablet (25 mg total) by mouth daily., Disp: 90 tablet, Rfl: 3   levocetirizine (XYZAL) 5 MG tablet, TAKE 1 TABLET BY MOUTH ONCE EVERY EVENING For allergies, Disp: 90 tablet, Rfl: 3   Magnesium  200 MG TABS, Take 1 tablet by mouth daily., Disp: , Rfl:    metoprolol succinate (TOPROL-XL) 25 MG 24 hr tablet, Take 0.5 tablets (12.5 mg total) by mouth daily., Disp: 45 tablet, Rfl: 3   pantoprazole (PROTONIX) 40 MG tablet, TAKE 1 TABLET BY MOUTH EVERY DAY (40 MG TOTAL) for acid reflux, Disp: 90 tablet, Rfl: 3   predniSONE (STERAPRED UNI-PAK 21 TAB) 10 MG (21) TBPK tablet, Use as directed on back of pill pack, Disp: 21 tablet, Rfl: 0  Gen: nontoxic female/ NAD. HEENT: no gross facial swelling/ erythema  Assessment/ Plan: 47 y.o. female   Acute recurrent maxillary sinusitis - Plan: amoxicillin-clavulanate (AUGMENTIN) 875-125 MG tablet, montelukast (SINGULAIR) 10 MG tablet  Augmentin sent to the pharmacy.  Has Diflucan on hand if needed.  Add Singulair to Xyzal and Flonase regimen in efforts to reduce recurrence.  Home care instructions reviewed and reasons for reevaluation discussed.  Follow-up as needed  Start time: 12:38pm End time: 12:46pm  Total time spent on patient care (including video visit/ documentation): 8 minutes  Orrie Lascano Hulen Skains, DO [facility] [phone]

## 2023-06-23 ENCOUNTER — Encounter: Payer: Self-pay | Admitting: Family Medicine

## 2023-06-23 ENCOUNTER — Ambulatory Visit (INDEPENDENT_AMBULATORY_CARE_PROVIDER_SITE_OTHER): Payer: No Typology Code available for payment source | Admitting: Family Medicine

## 2023-06-23 VITALS — BP 133/79 | HR 72 | Temp 98.5°F | Ht 66.0 in | Wt 205.0 lb

## 2023-06-23 DIAGNOSIS — I10 Essential (primary) hypertension: Secondary | ICD-10-CM | POA: Diagnosis not present

## 2023-06-23 DIAGNOSIS — Z0000 Encounter for general adult medical examination without abnormal findings: Secondary | ICD-10-CM

## 2023-06-23 DIAGNOSIS — E7800 Pure hypercholesterolemia, unspecified: Secondary | ICD-10-CM

## 2023-06-23 DIAGNOSIS — R7303 Prediabetes: Secondary | ICD-10-CM

## 2023-06-23 DIAGNOSIS — Z2821 Immunization not carried out because of patient refusal: Secondary | ICD-10-CM

## 2023-06-23 DIAGNOSIS — J301 Allergic rhinitis due to pollen: Secondary | ICD-10-CM

## 2023-06-23 DIAGNOSIS — Z0001 Encounter for general adult medical examination with abnormal findings: Secondary | ICD-10-CM | POA: Diagnosis not present

## 2023-06-23 DIAGNOSIS — E669 Obesity, unspecified: Secondary | ICD-10-CM

## 2023-06-23 DIAGNOSIS — K219 Gastro-esophageal reflux disease without esophagitis: Secondary | ICD-10-CM

## 2023-06-23 DIAGNOSIS — Z130 Encounter for screening for diseases of the blood and blood-forming organs and certain disorders involving the immune mechanism: Secondary | ICD-10-CM

## 2023-06-23 LAB — CBC WITH DIFFERENTIAL/PLATELET
Basophils Absolute: 0 10*3/uL (ref 0.0–0.2)
Basos: 1 %
EOS (ABSOLUTE): 0.2 10*3/uL (ref 0.0–0.4)
Eos: 3 %
Hematocrit: 33.8 % — ABNORMAL LOW (ref 34.0–46.6)
Hemoglobin: 10.8 g/dL — ABNORMAL LOW (ref 11.1–15.9)
Immature Grans (Abs): 0 10*3/uL (ref 0.0–0.1)
Immature Granulocytes: 0 %
Lymphocytes Absolute: 1.9 10*3/uL (ref 0.7–3.1)
Lymphs: 32 %
MCH: 25.9 pg — ABNORMAL LOW (ref 26.6–33.0)
MCHC: 32 g/dL (ref 31.5–35.7)
MCV: 81 fL (ref 79–97)
Monocytes Absolute: 0.5 10*3/uL (ref 0.1–0.9)
Monocytes: 8 %
Neutrophils Absolute: 3.4 10*3/uL (ref 1.4–7.0)
Neutrophils: 56 %
Platelets: 354 10*3/uL (ref 150–450)
RBC: 4.17 x10E6/uL (ref 3.77–5.28)
RDW: 15.4 % (ref 11.7–15.4)
WBC: 6 10*3/uL (ref 3.4–10.8)

## 2023-06-23 LAB — COMPREHENSIVE METABOLIC PANEL
ALT: 24 [IU]/L (ref 0–32)
AST: 21 [IU]/L (ref 0–40)
Albumin: 4.1 g/dL (ref 3.9–4.9)
Alkaline Phosphatase: 74 [IU]/L (ref 44–121)
BUN/Creatinine Ratio: 21 (ref 9–23)
BUN: 14 mg/dL (ref 6–24)
Bilirubin Total: <0.2 mg/dL (ref 0.0–1.2)
CO2: 23 mmol/L (ref 20–29)
Calcium: 9 mg/dL (ref 8.7–10.2)
Chloride: 103 mmol/L (ref 96–106)
Creatinine, Ser: 0.68 mg/dL (ref 0.57–1.00)
Globulin, Total: 2.6 g/dL (ref 1.5–4.5)
Glucose: 97 mg/dL (ref 70–99)
Potassium: 4.1 mmol/L (ref 3.5–5.2)
Sodium: 139 mmol/L (ref 134–144)
Total Protein: 6.7 g/dL (ref 6.0–8.5)
eGFR: 109 mL/min/{1.73_m2} (ref >59)

## 2023-06-23 LAB — LIPID PANEL
Chol/HDL Ratio: 4.5 {ratio} — ABNORMAL HIGH (ref 0.0–4.4)
Cholesterol, Total: 207 mg/dL — ABNORMAL HIGH (ref 100–199)
HDL: 46 mg/dL (ref >39)
LDL Chol Calc (NIH): 137 mg/dL — ABNORMAL HIGH (ref 0–99)
Triglycerides: 132 mg/dL (ref 0–149)
VLDL Cholesterol Cal: 24 mg/dL (ref 5–40)

## 2023-06-23 LAB — TSH: TSH: 1.95 u[IU]/mL (ref 0.450–4.500)

## 2023-06-23 LAB — BAYER DCA HB A1C WAIVED: HB A1C (BAYER DCA - WAIVED): 6.2 % — ABNORMAL HIGH (ref 4.8–5.6)

## 2023-06-23 MED ORDER — FLUTICASONE PROPIONATE 50 MCG/ACT NA SUSP
2.0000 | Freq: Every day | NASAL | 3 refills | Status: DC
Start: 2023-06-23 — End: 2024-06-24

## 2023-06-23 MED ORDER — MONTELUKAST SODIUM 10 MG PO TABS
10.0000 mg | ORAL_TABLET | Freq: Every day | ORAL | 3 refills | Status: DC
Start: 2023-06-23 — End: 2024-06-24

## 2023-06-23 MED ORDER — LEVOCETIRIZINE DIHYDROCHLORIDE 5 MG PO TABS
ORAL_TABLET | ORAL | 3 refills | Status: DC
Start: 2023-06-23 — End: 2024-06-24

## 2023-06-23 MED ORDER — HYDROCHLOROTHIAZIDE 25 MG PO TABS
25.0000 mg | ORAL_TABLET | Freq: Every day | ORAL | 3 refills | Status: DC
Start: 2023-06-23 — End: 2024-06-24

## 2023-06-23 MED ORDER — PANTOPRAZOLE SODIUM 40 MG PO TBEC
DELAYED_RELEASE_TABLET | ORAL | 3 refills | Status: DC
Start: 2023-06-23 — End: 2024-06-24

## 2023-06-23 MED ORDER — METOPROLOL SUCCINATE ER 25 MG PO TB24
12.5000 mg | ORAL_TABLET | Freq: Every day | ORAL | 3 refills | Status: DC
Start: 2023-06-23 — End: 2024-06-24

## 2023-06-23 NOTE — Patient Instructions (Signed)
Preventing Type 2 Diabetes Mellitus  Type 2 diabetes, also called type 2 diabetes mellitus, is a long-term (chronic) disease that affects sugar (glucose) levels in your blood. Normally, a hormone called insulin allows glucose to enter cells in your body. The cells use glucose for energy. With type 2 diabetes, you will have one or both of these problems:  Your pancreas does not make enough insulin.  Cells in your body do not respond properly to insulin that your body makes (insulin resistance).  Insulin resistance or lack of insulin causes extra glucose to build up in the blood instead of going into cells. As a result, high blood glucose (hyperglycemia) develops. That can cause many complications. Being overweight or obese and having an inactive (sedentary) lifestyle can increase your risk for diabetes. Type 2 diabetes can be delayed or prevented by making certain nutrition and lifestyle changes.  How can this condition affect me?  If you do not take steps to prevent diabetes, your blood glucose levels may keep increasing over time. Too much glucose in your blood for a long time can damage your blood vessels, heart, kidneys, nerves, and eyes.  Type 2 diabetes can lead to chronic health problems and complications, such as:  Heart disease.  Stroke.  Blindness.  Kidney disease.  Depression.  Poor circulation in your feet and legs. In severe cases, a foot or leg may need to be surgically removed (amputated).  What can increase my risk?  You may be more likely to develop type 2 diabetes if you:  Have type 2 diabetes in your family.  Are overweight or obese.  Have a sedentary lifestyle.  Have insulin resistance or a history of prediabetes.  Have a history of pregnancy-related (gestational) diabetes or polycystic ovary syndrome (PCOS).  What actions can I take to prevent this?  It can be difficult to recognize signs of type 2 diabetes. Taking action to prevent the disease before you develop symptoms is the best way to avoid  possible damage to your body. Making certain nutrition and lifestyle changes may prevent or delay the disease and related health problems.  Nutrition    Eat healthy meals and snacks regularly. Do not skip meals. Fruit or a handful of nuts is a healthy snack between meals.  Drink water throughout the day. Avoid drinks that contain added sugar, such as soda or sweetened tea. Drink enough fluid to keep your urine pale yellow.  Follow instructions from your health care provider about eating or drinking restrictions.  Limit the amount of food you eat by:  Managing how much you eat at a time (portion size).  Checking food labels for the serving sizes of food.  Using a kitchen scale to weigh amounts of food.  Saut or steam food instead of frying it. Cook with water or broth instead of oils or butter.  Limit saturated fat and salt (sodium) in your diet. Have no more than 1 tsp (2,400 mg) of sodium a day. If you have heart disease or high blood pressure, use less than ? tsp (1,500 mg) of sodium a day.  Lifestyle    Lose weight if needed and as told. Your health care provider can determine how much weight loss is best for you and can help you lose weight safely.  If you are overweight or obese, you may be told to lose at least 5?7% of your body weight.  Manage blood pressure, cholesterol, and stress. Your health care provider will help determine the best treatment  for you.  Do not use any products that contain nicotine or tobacco. These products include cigarettes, chewing tobacco, and vaping devices, such as e-cigarettes. If you need help quitting, ask your health care provider.  Activity    Do physical activity that makes your heart beat faster and makes you sweat (moderate intensity). Do this for at least 30 minutes on at least 5 days of the week, or as much as told by your health care provider.  Ask your health care provider what activities are safe for you. A mix of activities may be best, such as walking, swimming,  cycling, and strength training.  Try to add physical activity into your day. For example:  Park your car farther away than usual so that you walk more.  Take a walk during your lunch break.  Use stairs instead of elevators or escalators.  Walk or bike to work instead of driving.  Alcohol use  If you drink alcohol:  Limit how much you have to:  0?1 drink a day for women who are not pregnant.  0?2 drinks a day for men.  Know how much alcohol is in your drink. In the [place] one drink equals one 12 oz bottle of beer (355 mL), one 5 oz glass of wine (148 mL), or one 1 oz glass of hard liquor (44 mL).  General information  Talk with your health care provider about your risk factors and how you can reduce your risk for diabetes.  Have your blood glucose tested regularly, as told by your health care provider.  Get screening tests as told by your health care provider. You may have these regularly, especially if you have certain risk factors for type 2 diabetes.  Make an appointment with a registered dietitian. This diet and nutrition specialist can help you make a healthy eating plan and help you understand portion sizes and food labels.  Where to find support  Ask your health care provider to recommend a registered dietitian, a certified diabetes care and education specialist, or a weight loss program.  Look for local or online weight loss groups.  Join a gym, fitness club, or outdoor activity group, such as a walking club.  Where to find more information  For help and guidance and to learn more about diabetes and diabetes prevention, visit:  American Diabetes Association (ADA): www.diabetes.[employer] of Diabetes and Digestive and Kidney Diseases: CarFlippers.tn  To learn more about healthy eating, visit:  [place] Department of Agriculture Architect): https://ball-collins.biz/  Office of Disease Prevention and Health Promotion (ODPHP): ResearchName.uy  Summary  You can delay or prevent type 2 diabetes by eating healthy  foods, losing weight if needed, and increasing your physical activity.  Talk with your health care provider about your risk factors for type 2 diabetes and how you can reduce your risk.  It can be difficult to recognize the signs of type 2 diabetes. The best way to avoid possible damage to your body is to take action to prevent the disease before you develop symptoms.  Get screening tests as told by your health care provider.  This information is not intended to replace advice given to you by your health care provider. Make sure you discuss any questions you have with your health care provider.  Document Revised: 12/07/2020 Document Reviewed: 12/07/2020  Elsevier Patient Education  2024 [employer].

## 2023-06-23 NOTE — Progress Notes (Signed)
Tami Johnson is a 47 y.o. female presents to office today for annual physical exam examination.    Concerns today include: 1. None she reports doing well.   Health Maintenance Due  Topic Date Due   Cervical Cancer Screening (HPV/Pap Cotest)  07/07/2021   Refills needed today: all  Immunization History  Administered Date(s) Administered   Influenza Split 07/11/2013   Influenza,inj,Quad PF,6+ Mos 07/13/2015, 07/07/2016   Influenza-Unspecified 07/14/2014, 07/12/2016, 07/24/2019   Moderna Sars-Covid-2 Vaccination 02/17/2020, 03/17/2020   Td 12/10/2014   Past Medical History:  Diagnosis Date   Allergy    Bacterial vaginosis 07/11/2022   External hemorrhoid, thrombosed 05/04/2012   HTN (hypertension)    Open displaced fracture of middle phalanx of left index finger with routine healing 08/24/2015   Open fracture of middle phalanx of finger 05/29/2015   Overview:   S/p crossbow injury to nondominant left index finger     Social History   Socioeconomic History   Marital status: Married    Spouse name: Not on file   Number of children: Not on file   Years of education: Not on file   Highest education level: Not on file  Occupational History   Not on file  Tobacco Use   Smoking status: Never   Smokeless tobacco: Never  Vaping Use   Vaping status: Never Used  Substance and Sexual Activity   Alcohol use: No   Drug use: No   Sexual activity: Yes    Birth control/protection: None  Other Topics Concern   Not on file  Social History Narrative   Not on file   Social Determinants of Health   Financial Resource Strain: Not on file  Food Insecurity: Not on file  Transportation Needs: Not on file  Physical Activity: Not on file  Stress: Not on file  Social Connections: Not on file  Intimate Partner Violence: Not on file   Past Surgical History:  Procedure Laterality Date   FINGER SURGERY  06/10/15   Family History  Problem Relation Age of Onset   Colon polyps  Mother    Colon polyps Father    Hypertension Father    Heart attack Neg Hx    Stroke Neg Hx    Colon cancer Neg Hx    Esophageal cancer Neg Hx    Rectal cancer Neg Hx    Stomach cancer Neg Hx     Current Outpatient Medications:    Magnesium 200 MG TABS, Take 1 tablet by mouth daily., Disp: , Rfl:    fluticasone (FLONASE) 50 MCG/ACT nasal spray, Place 2 sprays into both nostrils daily., Disp: 48 g, Rfl: 3   hydrochlorothiazide (HYDRODIURIL) 25 MG tablet, Take 1 tablet (25 mg total) by mouth daily., Disp: 90 tablet, Rfl: 3   levocetirizine (XYZAL) 5 MG tablet, TAKE 1 TABLET BY MOUTH ONCE EVERY EVENING For allergies, Disp: 90 tablet, Rfl: 3   metoprolol succinate (TOPROL-XL) 25 MG 24 hr tablet, Take 0.5 tablets (12.5 mg total) by mouth daily., Disp: 45 tablet, Rfl: 3   montelukast (SINGULAIR) 10 MG tablet, Take 1 tablet (10 mg total) by mouth at bedtime., Disp: 90 tablet, Rfl: 3   pantoprazole (PROTONIX) 40 MG tablet, TAKE 1 TABLET BY MOUTH EVERY DAY (40 MG TOTAL) for acid reflux, Disp: 90 tablet, Rfl: 3  Allergies  Allergen Reactions   Ciprofloxacin Hives   Sulfa Antibiotics Hives, Other (See Comments) and Itching   Sulfamethoxazole-Trimethoprim Other (See Comments), Itching and Rash   Hydrocodone-Acetaminophen  Other (See Comments) and Cough     ROS: Review of Systems A comprehensive review of systems was negative except for: Eyes: positive for contacts/glasses    Physical exam BP 133/79   Pulse 72   Temp 98.5 F (36.9 C)   Ht 5\' 6"  (1.676 m)   Wt 205 lb (93 kg)   SpO2 94%   BMI 33.09 kg/m  General appearance: alert, cooperative, appears stated age, no distress, and moderately obese Head: Normocephalic, without obvious abnormality, atraumatic Eyes: negative findings: lids and lashes normal, conjunctivae and sclerae normal, corneas clear, pupils equal, round, reactive to light and accomodation, and wearing corrective lenses Ears: normal TM's and external ear canals both  ears Nose: Nares normal. Septum midline. Mucosa normal. No drainage or sinus tenderness. Hemostatic bleed noted along the right nasal septum Throat: lips, mucosa, and tongue normal; teeth and gums normal Neck: no adenopathy, supple, symmetrical, trachea midline, and thyroid not enlarged, symmetric, no tenderness/mass/nodules Back: symmetric, no curvature. ROM normal. No CVA tenderness. Lungs: clear to auscultation bilaterally Heart: regular rate and rhythm, S1, S2 normal, no murmur, click, rub or gallop Abdomen: soft, non-tender; bowel sounds normal; no masses,  no organomegaly and obese Extremities: extremities normal, atraumatic, no cyanosis or edema Pulses: 2+ and symmetric Skin: Skin color, texture, turgor normal. No rashes or lesions Lymph nodes: Cervical, supraclavicular, and axillary nodes normal. Neurologic: Grossly normal      06/23/2023    9:17 AM 10/19/2022   10:58 AM 09/06/2022    9:22 AM  Depression screen PHQ 2/9  Decreased Interest 0 0 0  Down, Depressed, Hopeless 0 0 0  PHQ - 2 Score 0 0 0  Altered sleeping  0 0  Tired, decreased energy  0 0  Change in appetite  0 0  Feeling bad or failure about yourself   0 0  Trouble concentrating  0 0  Moving slowly or fidgety/restless  0 0  Suicidal thoughts  0 0  PHQ-9 Score  0 0  Difficult doing work/chores  Not difficult at all Not difficult at all      06/23/2023    9:18 AM 10/19/2022   11:00 AM 09/06/2022    9:23 AM 06/06/2022    2:00 PM  GAD 7 : Generalized Anxiety Score  Nervous, Anxious, on Edge 0 0 0 0  Control/stop worrying 0 0 0 0  Worry too much - different things 0 0 0 0  Trouble relaxing 0 0 0 0  Restless 0 0 0 0  Easily annoyed or irritable 0 0 0 0  Afraid - awful might happen 0 0 0 0  Total GAD 7 Score 0 0 0 0  Anxiety Difficulty Not difficult at all Not difficult at all Not difficult at all Not difficult at all     Assessment/ Plan: Tami Johnson here for annual physical exam.   Annual  physical exam  Influenza vaccine refused  Primary hypertension - Plan: Comprehensive metabolic panel, hydrochlorothiazide (HYDRODIURIL) 25 MG tablet, metoprolol succinate (TOPROL-XL) 25 MG 24 hr tablet  Pure hypercholesterolemia - Plan: Comprehensive metabolic panel, Lipid panel, TSH  Pre-diabetes - Plan: Bayer DCA Hb A1c Waived  Obesity (BMI 30.0-34.9) - Plan: Comprehensive metabolic panel, Lipid panel, TSH, Bayer DCA Hb A1c Waived  Screening, anemia, deficiency, iron - Plan: CBC with Differential/Platelet  Seasonal allergic rhinitis due to pollen - Plan: fluticasone (FLONASE) 50 MCG/ACT nasal spray, levocetirizine (XYZAL) 5 MG tablet, montelukast (SINGULAIR) 10 MG tablet  Gastroesophageal reflux disease  without esophagitis - Plan: pantoprazole (PROTONIX) 40 MG tablet  Pap smear results were normal with last OB/GYN check.  I have printed these to be scanned into our system.  Continue to follow-up for regular OB/GYN checks as scheduled.  She declined influenza vaccination  Blood pressure well-controlled.  No changes needed.  Check fasting labs.  Reviewed her A1c with her today and this A1c is up to 6.2.  This is up from 6.1 last year.  Counseled on carbohydrate restriction, increase physical exercise.  Handout with low-carb snacking options provided today.  Screening for iron deficiency.  Xyzal Singulair and Flonase renewed.  GERD chronic and stable.  PPI renewed  Counseled on healthy lifestyle choices, including diet (rich in fruits, vegetables and lean meats and low in salt and simple carbohydrates) and exercise (at least 30 minutes of moderate physical activity daily).  Patient to follow up 1year  Sonal Dorwart M. Nadine Counts, DO

## 2023-06-27 ENCOUNTER — Other Ambulatory Visit: Payer: Self-pay

## 2023-06-27 DIAGNOSIS — D649 Anemia, unspecified: Secondary | ICD-10-CM

## 2023-06-28 LAB — FECAL OCCULT BLOOD, IMMUNOCHEMICAL: Fecal Occult Bld: NEGATIVE

## 2023-07-10 ENCOUNTER — Other Ambulatory Visit: Payer: No Typology Code available for payment source

## 2023-07-10 DIAGNOSIS — E7800 Pure hypercholesterolemia, unspecified: Secondary | ICD-10-CM

## 2023-07-10 DIAGNOSIS — D649 Anemia, unspecified: Secondary | ICD-10-CM

## 2023-07-11 LAB — ANEMIA PROFILE B
Basophils Absolute: 0 10*3/uL (ref 0.0–0.2)
Basos: 1 %
EOS (ABSOLUTE): 0.1 10*3/uL (ref 0.0–0.4)
Eos: 2 %
Ferritin: 7 ng/mL — ABNORMAL LOW (ref 15–150)
Folate: 16.6 ng/mL (ref >3.0)
Hematocrit: 33.8 % — ABNORMAL LOW (ref 34.0–46.6)
Hemoglobin: 10.7 g/dL — ABNORMAL LOW (ref 11.1–15.9)
Immature Grans (Abs): 0 10*3/uL (ref 0.0–0.1)
Immature Granulocytes: 0 %
Iron Saturation: 5 % — CL (ref 15–55)
Iron: 20 ug/dL — ABNORMAL LOW (ref 27–159)
Lymphocytes Absolute: 1.9 10*3/uL (ref 0.7–3.1)
Lymphs: 33 %
MCH: 25.4 pg — ABNORMAL LOW (ref 26.6–33.0)
MCHC: 31.7 g/dL (ref 31.5–35.7)
MCV: 80 fL (ref 79–97)
Monocytes Absolute: 0.4 10*3/uL (ref 0.1–0.9)
Monocytes: 7 %
Neutrophils Absolute: 3.2 10*3/uL (ref 1.4–7.0)
Neutrophils: 57 %
Platelets: 284 10*3/uL (ref 150–450)
RBC: 4.22 x10E6/uL (ref 3.77–5.28)
RDW: 15.4 % (ref 11.7–15.4)
Retic Ct Pct: 1.7 % (ref 0.6–2.6)
Total Iron Binding Capacity: 414 ug/dL (ref 250–450)
UIBC: 394 ug/dL (ref 131–425)
Vitamin B-12: 388 pg/mL (ref 232–1245)
WBC: 5.7 10*3/uL (ref 3.4–10.8)

## 2023-07-24 ENCOUNTER — Other Ambulatory Visit: Payer: Self-pay | Admitting: Obstetrics and Gynecology

## 2023-07-24 DIAGNOSIS — Z1231 Encounter for screening mammogram for malignant neoplasm of breast: Secondary | ICD-10-CM

## 2023-09-15 ENCOUNTER — Ambulatory Visit (INDEPENDENT_AMBULATORY_CARE_PROVIDER_SITE_OTHER): Payer: No Typology Code available for payment source | Admitting: Family Medicine

## 2023-09-15 ENCOUNTER — Encounter: Payer: Self-pay | Admitting: Family Medicine

## 2023-09-15 VITALS — BP 123/71 | HR 49 | Temp 98.0°F | Ht 66.0 in | Wt 192.2 lb

## 2023-09-15 DIAGNOSIS — D509 Iron deficiency anemia, unspecified: Secondary | ICD-10-CM | POA: Diagnosis not present

## 2023-09-15 DIAGNOSIS — J340 Abscess, furuncle and carbuncle of nose: Secondary | ICD-10-CM | POA: Diagnosis not present

## 2023-09-15 MED ORDER — MUPIROCIN 2 % EX OINT
1.0000 | TOPICAL_OINTMENT | Freq: Two times a day (BID) | CUTANEOUS | 0 refills | Status: AC
Start: 2023-09-15 — End: 2023-09-22

## 2023-09-15 NOTE — Progress Notes (Signed)
Subjective: CC: Anemia PCP: Raliegh Ip, DO NWG:NFAOZHY Tami Johnson is a 47 y.o. female presenting to clinic today for:  1.  Iron deficiency anemia Patient reports that she has been taking ferrous sulfate 325 twice daily faithfully with orange juice.  She reports some black stools but otherwise no concerning symptoms or signs.  2.  Skin lesion Patient reports couple day history of a lesion on the tip of her right side of the nose.  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  She notes that she scratched it and it bled profusely so she covered it with a little skin glue to stop the bleeding.  Of note she utilizes a microscope regularly at her work.  Does not recall getting in contact with anything unusual but works for hospital system   ROS: Per HPI  Allergies  Allergen Reactions   Ciprofloxacin Hives   Sulfa Antibiotics Hives, Other (See Comments) and Itching   Sulfamethoxazole-Trimethoprim Other (See Comments), Itching and Rash   Hydrocodone-Acetaminophen Other (See Comments) and Cough   Past Medical History:  Diagnosis Date   Allergy    Bacterial vaginosis 07/11/2022   External hemorrhoid, thrombosed 05/04/2012   HTN (hypertension)    Open displaced fracture of middle phalanx of left index finger with routine healing 08/24/2015   Open fracture of middle phalanx of finger 05/29/2015   Overview:   S/p crossbow injury to nondominant left index finger      Current Outpatient Medications:    fluticasone (FLONASE) 50 MCG/ACT nasal spray, Place 2 sprays into both nostrils daily., Disp: 48 g, Rfl: 3   hydrochlorothiazide (HYDRODIURIL) 25 MG tablet, Take 1 tablet (25 mg total) by mouth daily., Disp: 90 tablet, Rfl: 3   levocetirizine (XYZAL) 5 MG tablet, TAKE 1 TABLET BY MOUTH ONCE EVERY EVENING For allergies, Disp: 90 tablet, Rfl: 3   Magnesium 200 MG TABS, Take 1 tablet by mouth daily., Disp: , Rfl:    metoprolol succinate (TOPROL-XL) 25 MG 24 hr  tablet, Take 0.5 tablets (12.5 mg total) by mouth daily., Disp: 45 tablet, Rfl: 3   montelukast (SINGULAIR) 10 MG tablet, Take 1 tablet (10 mg total) by mouth at bedtime., Disp: 90 tablet, Rfl: 3   pantoprazole (PROTONIX) 40 MG tablet, TAKE 1 TABLET BY MOUTH EVERY DAY (40 MG TOTAL) for acid reflux, Disp: 90 tablet, Rfl: 3 Social History   Socioeconomic History   Marital status: Married    Spouse name: Not on file   Number of children: Not on file   Years of education: Not on file   Highest education level: Not on file  Occupational History   Not on file  Tobacco Use   Smoking status: Never   Smokeless tobacco: Never  Vaping Use   Vaping status: Never Used  Substance and Sexual Activity   Alcohol use: No   Drug use: No   Sexual activity: Yes    Birth control/protection: None  Other Topics Concern   Not on file  Social History Narrative   Not on file   Social Drivers of Health   Financial Resource Strain: Not on file  Food Insecurity: Not on file  Transportation Needs: Not on file  Physical Activity: Not on file  Stress: Not on file  Social Connections: Not on file  Intimate Partner Violence: Not on file   Family History  Problem Relation Age of Onset   Colon polyps Mother    Colon polyps  Father    Hypertension Father    Heart attack Neg Hx    Stroke Neg Hx    Colon cancer Neg Hx    Esophageal cancer Neg Hx    Rectal cancer Neg Hx    Stomach cancer Neg Hx     Objective: Office vital signs reviewed. BP 123/71   Pulse (!) 49   Temp 98 F (36.7 C) (Oral)   Ht 5\' 6"  (1.676 m)   Wt 192 lb 3.2 oz (87.2 kg)   SpO2 98%   BMI 31.02 kg/m   Physical Examination:  General: Awake, alert, well nourished, No acute distress HEENT: No conjunctival pallor.  She has a furuncle on the right side of the nose that is approximately millimeter in size.  Assessment/ Plan: 47 y.o. female   Iron deficiency anemia, unspecified iron deficiency anemia type - Plan: CBC, Iron,  TIBC and Ferritin Panel  Nasal furuncle - Plan: mupirocin ointment (BACTROBAN) 2 %  Recheck labs.  We discussed that if persistently iron deficient anemic we will plan for referral to hematology for discussion of possible iron infusions and further workup  Lesion on nose appears to be a furuncle.  I am going to treat her with some Bactroban just because she works with many pathogens.  I discussed with her that if does not resolve after treatment to let me know and I will place referral back to [facility] dermatology   Tami Huizinga Hulen Skains, DO [facility] [phone]

## 2023-09-16 LAB — IRON,TIBC AND FERRITIN PANEL
Ferritin: 44 ng/mL (ref 15–150)
Iron Saturation: 22 % (ref 15–55)
Iron: 73 ug/dL (ref 27–159)
Total Iron Binding Capacity: 333 ug/dL (ref 250–450)
UIBC: 260 ug/dL (ref 131–425)

## 2023-09-16 LAB — CBC
Hematocrit: 40.7 % (ref 34.0–46.6)
Hemoglobin: 13.6 g/dL (ref 11.1–15.9)
MCH: 29.2 pg (ref 26.6–33.0)
MCHC: 33.4 g/dL (ref 31.5–35.7)
MCV: 87 fL (ref 79–97)
Platelets: 247 10*3/uL (ref 150–450)
RBC: 4.66 x10E6/uL (ref 3.77–5.28)
RDW: 16.8 % — ABNORMAL HIGH (ref 11.7–15.4)
WBC: 8.4 10*3/uL (ref 3.4–10.8)

## 2023-09-18 ENCOUNTER — Encounter: Payer: Self-pay | Admitting: Family Medicine

## 2023-09-25 ENCOUNTER — Ambulatory Visit
Admission: RE | Admit: 2023-09-25 | Discharge: 2023-09-25 | Disposition: A | Discharge status: Home / Self Care | Payer: No Typology Code available for payment source | Source: Ambulatory Visit | Attending: Obstetrics and Gynecology | Admitting: Obstetrics and Gynecology

## 2023-09-25 DIAGNOSIS — Z1231 Encounter for screening mammogram for malignant neoplasm of breast: Secondary | ICD-10-CM

## 2023-11-23 ENCOUNTER — Ambulatory Visit: Payer: Self-pay | Admitting: Family Medicine

## 2023-11-23 ENCOUNTER — Encounter: Payer: Self-pay | Admitting: Family Medicine

## 2023-11-23 ENCOUNTER — Telehealth: Payer: No Typology Code available for payment source | Admitting: Family Medicine

## 2023-11-23 DIAGNOSIS — R509 Fever, unspecified: Secondary | ICD-10-CM | POA: Diagnosis not present

## 2023-11-23 DIAGNOSIS — R059 Cough, unspecified: Secondary | ICD-10-CM

## 2023-11-23 DIAGNOSIS — Z20828 Contact with and (suspected) exposure to other viral communicable diseases: Secondary | ICD-10-CM

## 2023-11-23 MED ORDER — OSELTAMIVIR PHOSPHATE 75 MG PO CAPS: 75.0000 mg | ORAL_CAPSULE | Freq: Two times a day (BID) | ORAL | 0 refills | Status: AC

## 2023-11-23 NOTE — Progress Notes (Signed)
 Virtual Visit via Video   I connected with patient on 11/23/23 at 0950 by a video enabled telemedicine application and verified that I am speaking with the correct person using two identifiers.  Location patient: Home Location provider: [facility] Persons participating in the virtual visit: Patient and Provider  I discussed the limitations of evaluation and management by telemedicine and the availability of in person appointments. The patient expressed understanding and agreed to proceed.  Subjective:   HPI:  Pt presents today for  Chief Complaint  Patient presents with   URI   Discussed the use of AI scribe software for clinical note transcription with the patient, who gave verbal consent to proceed.  History of Present Illness   Tami Johnson is a 48 year old female who presents with headache, cough, and fever after exposure to influenza.  She was exposed to influenza through her daughter, who was diagnosed with the illness this morning and prescribed Tamiflu. She has been experiencing a severe headache that began yesterday morning, causing her to leave work early and stay in bed all day.  She also has a cough with some chest congestion. This morning, she experienced sweating and had a fever of 101.47F, as measured by her husband.   ROS per HPI   Patient Active Problem List   Diagnosis Date Noted   Obesity (BMI 30.0-34.9) 06/23/2023   Menorrhagia 07/11/2022   Pre-diabetes 06/06/2022   Pure hypercholesterolemia 04/13/2021   Chronic sinusitis 03/10/2020   Vitamin D deficiency 10/01/2019   PAC (premature atrial contraction) 08/25/2014   Primary hypertension 08/25/2014   Breast lump 10/27/2013    Social History   Tobacco Use   Smoking status: Never   Smokeless tobacco: Never  Substance Use Topics   Alcohol use: No    Current Outpatient Medications:    oseltamivir (TAMIFLU) 75 MG capsule, Take 1 capsule (75 mg total) by mouth  2 (two) times daily for 5 days., Disp: 10 capsule, Rfl: 0   ferrous sulfate 325 (65 FE) MG tablet, Take 325 mg by mouth daily with breakfast., Disp: , Rfl:    fluticasone (FLONASE) 50 MCG/ACT nasal spray, Place 2 sprays into both nostrils daily., Disp: 48 g, Rfl: 3   hydrochlorothiazide (HYDRODIURIL) 25 MG tablet, Take 1 tablet (25 mg total) by mouth daily., Disp: 90 tablet, Rfl: 3   levocetirizine (XYZAL) 5 MG tablet, TAKE 1 TABLET BY MOUTH ONCE EVERY EVENING For allergies, Disp: 90 tablet, Rfl: 3   Magnesium 200 MG TABS, Take 1 tablet by mouth daily., Disp: , Rfl:    metoprolol succinate (TOPROL-XL) 25 MG 24 hr tablet, Take 0.5 tablets (12.5 mg total) by mouth daily., Disp: 45 tablet, Rfl: 3   montelukast (SINGULAIR) 10 MG tablet, Take 1 tablet (10 mg total) by mouth at bedtime., Disp: 90 tablet, Rfl: 3   pantoprazole (PROTONIX) 40 MG tablet, TAKE 1 TABLET BY MOUTH EVERY DAY (40 MG TOTAL) for acid reflux, Disp: 90 tablet, Rfl: 3  Allergies  Allergen Reactions   Ciprofloxacin Hives   Sulfa Antibiotics Hives, Other (See Comments) and Itching   Sulfamethoxazole-Trimethoprim Other (See Comments), Itching and Rash   Hydrocodone-Acetaminophen Other (See Comments) and Cough    Objective:   There were no vitals taken for this visit.  Patient is well-developed, well-nourished in no acute distress.  Resting comfortably at home.  Head is normocephalic, atraumatic.  No labored breathing.  Speech is clear and coherent with logical content.  Patient is  alert and oriented at baseline.    Assessment and Plan:   Sharma was seen today for uri.  Diagnoses and all orders for this visit:  Fever and chills -     oseltamivir (TAMIFLU) 75 MG capsule; Take 1 capsule (75 mg total) by mouth 2 (two) times daily for 5 days.  Cough in adult -     oseltamivir (TAMIFLU) 75 MG capsule; Take 1 capsule (75 mg total) by mouth 2 (two) times daily for 5 days.  Exposure to influenza -     oseltamivir  (TAMIFLU) 75 MG capsule; Take 1 capsule (75 mg total) by mouth 2 (two) times daily for 5 days.    Assessment and Plan    [name] presents with symptoms consistent with influenza, including headache, cough, fever (101.93F), and sweating. Her daughter has been diagnosed with influenza and prescribed Tamiflu, suggesting likely exposure. She has been symptomatic for approximately one day. Discussed the risks of Tamiflu, including nausea, vomiting, diarrhea, and nightmares, which are more common in children. Emphasized the importance of hydration and rest. Recommended acetaminophen or ibuprofen for fever, chills, and body aches, and dextromethorphan for cough. Informed her to report any worsening or new symptoms. - Prescribe Tamiflu 75 mg twice a day for 5 days - Recommend hydration and rest - Suggest acetaminophen or ibuprofen as needed for fever, chills, and body aches - Recommend dextromethorphan for cough - Send prescriptions to [facility] in [place] - Advise to report any worsening or new symptoms.        Return if symptoms worsen or fail to improve.  Kari Baars, FNP-C [facility] [address] [place], [place] [address] [phone]  11/23/2023  Time spent with the patient: 12 minutes, of which >50% was spent in obtaining information about symptoms, reviewing previous labs, evaluations, and treatments, counseling about condition (please see the discussed topics above), and developing a plan to further investigate it; had a number of questions which I addressed.

## 2023-11-23 NOTE — Telephone Encounter (Signed)
 Summary: Symptomatic, seeking Tamiflu   Copied From CRM [id number]. Reason for Triage: Pt says she has a "bad headache, chest congestion, miserable in head I cannot get up without my head throbbing. Temp of 101.3 this morning."  She is requesting Tamiflu, her daughter has tested positive for the flu and was brought in for an appt on Tuesday. Tamiflu prescribed for daughter, Pt wants Tamiflu as well  Best contact: [phone]

## 2023-11-23 NOTE — Telephone Encounter (Signed)
   Chief Complaint: flu symptoms  Symptoms: fever, head congestion, cough Frequency: constant   Disposition: [] ED /[] Urgent Care (no appt availability in office) / [x] Appointment(In office/virtual)/ []  [facility]/ [] Home Care/ [] Refused Recommended Disposition /[] [facility]/ []  Follow-up with PCP Additional Notes: Pt calling with flu symptoms. Pt has fever, headache, and head congestion. Pt's daughter tested positive for flu on Tuesday and was given Tamifu. Pt started feeling bad yesterday. Pt's temp is 101.3. Pt states she can't come in for appt. Per protocol, pt to be seen with 4 hours. Pt has a virtual appt today at 1005. RN gave care advice and pt verbalized understanding.            Copied from CRM [id number]. Topic: Clinical - Pink Word Triage >> Nov 23, 2023  8:24 AM Dondra Prader E wrote: Reason for Triage: Pt says she has a "bad headache, chest congestion, miserable in head I cannot get up without my head throbbing. Temp of 101.3 this morning."   She is requesting Tamiflu, her daughter has tested positive for the flu and was brought in for an appt on Tuesday. Tamiflu prescribed for daughter, Pt wants Tamiflu as well Reason for Disposition  [1] Fever > 101 F (38.3 C) AND [2] age > 60 years  Answer Assessment - Initial Assessment Questions 1. WORST SYMPTOM: "What is your worst symptom?" (e.g., cough, runny nose, muscle aches, headache, sore throat, fever)      Bad headache  2. ONSET: "When did your flu symptoms start?"      Yesterday  3. COUGH: "How bad is the cough?"       Yes- productive  4. RESPIRATORY DISTRESS: "Describe your breathing."      denies 5. FEVER: "Do you have a fever?" If Yes, ask: "What is your temperature, how was it measured, and when did it start?"     101.3 6. EXPOSURE: "Were you exposed to someone with influenza?"       Yes, daughter  7. FLU VACCINE: "Did you get a flu shot this year?"     no 8. HIGH RISK DISEASE: "Do you have  any chronic medical problems?" (e.g., heart or lung disease, asthma, weak immune system, or other HIGH RISK conditions)     Denies   10. OTHER SYMPTOMS: "Do you have any other symptoms?"  (e.g., runny nose, muscle aches, headache, sore throat)       Cough, fever, congestion  Protocols used: Influenza (Flu) - [facility]

## 2023-12-15 ENCOUNTER — Encounter: Payer: Self-pay | Admitting: Family Medicine

## 2023-12-15 ENCOUNTER — Ambulatory Visit (INDEPENDENT_AMBULATORY_CARE_PROVIDER_SITE_OTHER): Payer: No Typology Code available for payment source | Admitting: Family Medicine

## 2023-12-15 VITALS — BP 127/71 | HR 60 | Temp 98.3°F | Ht 66.0 in | Wt 187.8 lb

## 2023-12-15 DIAGNOSIS — J329 Chronic sinusitis, unspecified: Secondary | ICD-10-CM

## 2023-12-15 DIAGNOSIS — D508 Other iron deficiency anemias: Secondary | ICD-10-CM | POA: Diagnosis not present

## 2023-12-15 DIAGNOSIS — R7303 Prediabetes: Secondary | ICD-10-CM | POA: Diagnosis not present

## 2023-12-15 LAB — BAYER DCA HB A1C WAIVED: HB A1C (BAYER DCA - WAIVED): 5.6 % (ref 4.8–5.6)

## 2023-12-15 MED ORDER — PREDNISONE 10 MG PO TABS: ORAL_TABLET | ORAL | 0 refills | Status: DC
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
## 2023-12-15 NOTE — Patient Instructions (Signed)
 Tremendous improvement in sugar!  Great job. Keep up the good work.

## 2023-12-16 LAB — CBC
Hematocrit: 39.2 % (ref 34.0–46.6)
Hemoglobin: 13.2 g/dL (ref 11.1–15.9)
MCH: 30.6 pg (ref 26.6–33.0)
MCHC: 33.7 g/dL (ref 31.5–35.7)
MCV: 91 fL (ref 79–97)
Platelets: 216 10*3/uL (ref 150–450)
RBC: 4.31 x10E6/uL (ref 3.77–5.28)
RDW: 12.8 % (ref 11.7–15.4)
WBC: 6.1 10*3/uL (ref 3.4–10.8)

## 2023-12-16 LAB — IRON,TIBC AND FERRITIN PANEL
Ferritin: 77 ng/mL (ref 15–150)
Iron Saturation: 29 % (ref 15–55)
Iron: 88 ug/dL (ref 27–159)
Total Iron Binding Capacity: 301 ug/dL (ref 250–450)
UIBC: 213 ug/dL (ref 131–425)

## 2023-12-18 ENCOUNTER — Encounter: Payer: Self-pay | Admitting: Family Medicine

## 2024-03-04 ENCOUNTER — Ambulatory Visit: Payer: Self-pay

## 2024-03-04 NOTE — Telephone Encounter (Signed)
 FYI Only or Action Required?: FYI only for provider  Patient was last seen in primary care on 12/15/2023 by Eliodoro Guerin, DO. Called Nurse Triage reporting Hemorrhoids. Symptoms began several days ago. Interventions attempted: OTC medications: preparation H . Symptoms are: unchanged.  Triage Disposition: See PCP Within 2 Weeks  Patient/caregiver understands and will follow disposition?: Yes                Copied from CRM [id number]. Topic: Clinical - Red Word Triage >> Mar 04, 2024  2:04 PM Felizardo Hotter wrote: Red Word that prompted transfer to Nurse Triage: Pt called stated hemorrhoid is bleeding. Reason for Disposition  [1] Rectal bleeding is minimal (e.g., blood just on toilet paper, few drops, streaks on surface of normal formed BM) AND [2] bleeding recurs 3 or more times on treatment  Answer Assessment - Initial Assessment Questions 1. APPEARANCE of BLOOD: "What color is it?" "Is it passed separately, on the surface of the stool, or mixed in with the stool?"      On panties 2. AMOUNT: "How much blood was passed?"      Small about 3. FREQUENCY: "How many times has blood been passed with the stools?"      3 4. ONSET: "When was the blood first seen in the stools?" (Days or weeks)      thursday 5. DIARRHEA: "Is there also some diarrhea?" If Yes, ask: "How many diarrhea stools in the past 24 hours?"      no 6. CONSTIPATION: "Do you have constipation?" If Yes, ask: "How bad is it?"     no 7. RECURRENT SYMPTOMS: "Have you had blood in your stools before?" If Yes, ask: "When was the last time?" and "What happened that time?"      A while ago 8. BLOOD THINNERS: "Do you take any blood thinners?" (e.g., Coumadin/warfarin, Pradaxa/dabigatran, aspirin)     no 9. OTHER SYMPTOMS: "Do you have any other symptoms?"  (e.g., abdomen pain, vomiting, dizziness, fever)     no  Protocols used: Rectal Bleeding-A-AH

## 2024-03-05 ENCOUNTER — Encounter: Payer: Self-pay | Admitting: Nurse Practitioner

## 2024-03-05 ENCOUNTER — Ambulatory Visit: Admitting: Nurse Practitioner

## 2024-03-05 VITALS — BP 148/93 | HR 54 | Temp 98.2°F | Ht 66.0 in | Wt 186.0 lb

## 2024-03-05 DIAGNOSIS — K644 Residual hemorrhoidal skin tags: Secondary | ICD-10-CM | POA: Diagnosis not present

## 2024-03-05 MED ORDER — HYDROCORTISONE (PERIANAL) 2.5 % EX CREA
1.0000 | TOPICAL_CREAM | Freq: Two times a day (BID) | CUTANEOUS | 0 refills | Status: DC
Start: 2024-03-05 — End: 2024-04-02

## 2024-03-05 MED ORDER — HYDROCORTISONE ACETATE 25 MG RE SUPP: 25.0000 mg | Freq: Two times a day (BID) | RECTAL | 0 refills | Status: DC

## 2024-03-05 MED ORDER — OXYCODONE-ACETAMINOPHEN 5-325 MG PO TABS: 1.0000 | ORAL_TABLET | ORAL | 0 refills | Status: AC | PRN

## 2024-03-05 NOTE — Patient Instructions (Signed)
 Hemorrhoids Hemorrhoids are swollen veins that may form: In the butt (rectum). These are called internal hemorrhoids. Around the opening of the butt (anus). These are called external hemorrhoids. Most hemorrhoids do not cause very bad problems. They often get better with changes to your lifestyle and what you eat. What are the causes? Having trouble pooping (constipation) or watery poop (diarrhea). Pushing too hard when you poop. Pregnancy. Being very overweight (obese). Sitting for too long. Riding a bike for a long time. Heavy lifting or other things that take a lot of effort. Anal sex. What are the signs or symptoms? Pain. Itching or soreness in the butt. Bleeding from the butt. Leaking poop. Swelling. One or more lumps around the opening of your butt. How is this treated? In most cases, hemorrhoids can be treated at home. You may be told to: Change what you eat. Make changes to your lifestyle. If these treatments do not help, you may need to have a procedure done. Your doctor may need to: Place rubber bands at the bottom of the hemorrhoids to make them fall off. Put medicine into the hemorrhoids to shrink them. Shine a type of light on the hemorrhoids to cause them to fall off. Do surgery to get rid of the hemorrhoids. Follow these instructions at home: Medicines Take over-the-counter and prescription medicines only as told by your doctor. Use creams with medicine in them or medicines that you put in your butt as told by your doctor. Eating and drinking  Eat foods that have a lot of fiber in them. These include whole grains, beans, nuts, fruits, and vegetables. Ask your doctor about taking products that have fiber added to them (fibersupplements). Take in less fat. You can do this by: Eating low-fat dairy products. Eating less red meat. Staying away from processed foods. Drink enough fluid to keep your pee (urine) pale yellow. Managing pain and swelling  Take a  warm-water bath (sitz bath) for 20 minutes to ease pain. Do this 3-4 times a day. You may do this in a bathtub. You may also use a portable sitz bath that fits over the toilet. If told, put ice on the painful area. It may help to use ice between your warm baths. Put ice in a plastic bag. Place a towel between your skin and the bag. Leave the ice on for 20 minutes, 2-3 times a day. If your skin turns bright red, take off the ice right away to prevent skin damage. The risk of damage is higher if you cannot feel pain, heat, or cold. General instructions Exercise. Ask your doctor how much and what kind of exercise is best for you. Go to the bathroom when you need to poop. Do not wait. Try not to push too hard when you poop. Keep your butt dry and clean. Use wet toilet paper or moist towelettes after you poop. Do not sit on the toilet for a long time. Contact a doctor if: You have pain and swelling that do not get better with treatment. You have trouble pooping. You cannot poop. You have pain or swelling outside the area of the hemorrhoids. Get help right away if: You have bleeding from the butt that will not stop. This information is not intended to replace advice given to you by your health care provider. Make sure you discuss any questions you have with your health care provider. Document Revised: 05/25/2022 Document Reviewed: 05/25/2022 Elsevier Patient Education  2024 [employer].

## 2024-03-05 NOTE — Progress Notes (Signed)
   Subjective:    Patient ID: Tami Johnson, female    DOB: 11-03-1975, 48 y.o.   MRN: [id number]   Chief Complaint: external hemorrhoids   HPI  Patient in c/o external hemorrhoids. She has had them for years. She was told years ago that she had internal hemorrhoids when she had colonoscopy. Last Thursday she was lifting something heavy and one popped out. Feels like it is getting bigger. She has use preparation H wipes and creams with no results.  Soaks have n ot helped either Patient Active Problem List   Diagnosis Date Noted   Obesity (BMI 30.0-34.9) 06/23/2023   Menorrhagia 07/11/2022   Pre-diabetes 06/06/2022   Pure hypercholesterolemia 04/13/2021   Chronic sinusitis 03/10/2020   Vitamin D deficiency 10/01/2019   PAC (premature atrial contraction) 08/25/2014   Primary hypertension 08/25/2014   Breast lump 10/27/2013       Review of Systems  Constitutional:  Negative for diaphoresis.  Eyes:  Negative for pain.  Respiratory:  Negative for shortness of breath.   Cardiovascular:  Negative for chest pain, palpitations and leg swelling.  Gastrointestinal:  Negative for abdominal pain.  Endocrine: Negative for polydipsia.  Skin:  Negative for rash.  Neurological:  Negative for dizziness, weakness and headaches.  Hematological:  Does not bruise/bleed easily.  All other systems reviewed and are negative.      Objective:   Physical Exam Constitutional:      Appearance: Normal appearance. She is obese.  Cardiovascular:     Rate and Rhythm: Normal rate and regular rhythm.     Heart sounds: Normal heart sounds.  Pulmonary:     Effort: Pulmonary effort is normal.     Breath sounds: Normal breath sounds.  Genitourinary:    Comments: 5cm thrombosed tender external hemorrhoid Skin:    General: Skin is warm.  Neurological:     General: No focal deficit present.     Mental Status: She is alert and oriented to person, place, and time.  Psychiatric:        Mood and  Affect: Mood normal.        Behavior: Behavior normal.     BP (!) 148/93   Pulse (!) 54   Temp 98.2 F (36.8 C) (Temporal)   Ht 5\' 6"  (1.676 m)   Wt 186 lb (84.4 kg)   SpO2 97%   BMI 30.02 kg/m        Assessment & Plan:   Tami Johnson in today with chief complaint of external hemorrhoids   1. External hemorrhoids (Primary) Try comfry cream OTC Avoid straining - hydrocortisone (ANUSOL-HC) 2.5 % rectal cream; Place 1 Application rectally 2 (two) times daily.  Dispense: 30 g; Refill: 0 - hydrocortisone (ANUSOL-HC) 25 MG suppository; Place 1 suppository (25 mg total) rectally 2 (two) times daily.  Dispense: 12 suppository; Refill: 0 - oxyCODONE-acetaminophen (PERCOCET/ROXICET) 5-325 MG tablet; Take 1 tablet by mouth every 4 (four) hours as needed for up to 5 days for severe pain (pain score 7-10).  Dispense: 20 tablet; Refill: 0    The above assessment and management plan was discussed with the patient. The patient verbalized understanding of and has agreed to the management plan. Patient is aware to call the clinic if symptoms persist or worsen. Patient is aware when to return to the clinic for a follow-up visit. Patient educated on when it is appropriate to go to the emergency department.   Mary-Margaret Gaylyn Keas, FNP

## 2024-04-02 ENCOUNTER — Other Ambulatory Visit: Payer: Self-pay | Admitting: Nurse Practitioner

## 2024-04-02 DIAGNOSIS — K644 Residual hemorrhoidal skin tags: Secondary | ICD-10-CM

## 2024-06-24 ENCOUNTER — Ambulatory Visit: Payer: No Typology Code available for payment source | Admitting: Family Medicine

## 2024-06-24 ENCOUNTER — Ambulatory Visit: Payer: Self-pay | Admitting: Family Medicine

## 2024-06-24 ENCOUNTER — Encounter: Payer: Self-pay | Admitting: Family Medicine

## 2024-06-24 VITALS — BP 125/70 | HR 71 | Temp 98.7°F | Ht 68.0 in | Wt 189.1 lb

## 2024-06-24 DIAGNOSIS — D508 Other iron deficiency anemias: Secondary | ICD-10-CM

## 2024-06-24 DIAGNOSIS — E559 Vitamin D deficiency, unspecified: Secondary | ICD-10-CM

## 2024-06-24 DIAGNOSIS — H905 Unspecified sensorineural hearing loss: Secondary | ICD-10-CM

## 2024-06-24 DIAGNOSIS — Z789 Other specified health status: Secondary | ICD-10-CM

## 2024-06-24 DIAGNOSIS — Z0001 Encounter for general adult medical examination with abnormal findings: Secondary | ICD-10-CM | POA: Diagnosis not present

## 2024-06-24 DIAGNOSIS — E66811 Obesity, class 1: Secondary | ICD-10-CM

## 2024-06-24 DIAGNOSIS — R7303 Prediabetes: Secondary | ICD-10-CM

## 2024-06-24 DIAGNOSIS — E7800 Pure hypercholesterolemia, unspecified: Secondary | ICD-10-CM | POA: Diagnosis not present

## 2024-06-24 DIAGNOSIS — Z23 Encounter for immunization: Secondary | ICD-10-CM

## 2024-06-24 DIAGNOSIS — K219 Gastro-esophageal reflux disease without esophagitis: Secondary | ICD-10-CM

## 2024-06-24 DIAGNOSIS — I10 Essential (primary) hypertension: Secondary | ICD-10-CM | POA: Diagnosis not present

## 2024-06-24 DIAGNOSIS — Z0000 Encounter for general adult medical examination without abnormal findings: Secondary | ICD-10-CM

## 2024-06-24 DIAGNOSIS — K644 Residual hemorrhoidal skin tags: Secondary | ICD-10-CM

## 2024-06-24 DIAGNOSIS — J301 Allergic rhinitis due to pollen: Secondary | ICD-10-CM

## 2024-06-24 LAB — LIPID PANEL

## 2024-06-24 LAB — BAYER DCA HB A1C WAIVED: HB A1C (BAYER DCA - WAIVED): 4.9 % (ref 4.8–5.6)

## 2024-06-24 MED ORDER — FERROUS SULFATE 325 (65 FE) MG PO TABS: 325.0000 mg | ORAL_TABLET | Freq: Every day | ORAL | 4 refills | Status: AC

## 2024-06-24 MED ORDER — HYDROCORTISONE (PERIANAL) 2.5 % EX CREA
1.0000 | TOPICAL_CREAM | Freq: Two times a day (BID) | CUTANEOUS | 0 refills | Status: AC
Start: 2024-06-24 — End: ?

## 2024-06-24 MED ORDER — METOPROLOL SUCCINATE ER 25 MG PO TB24
12.5000 mg | ORAL_TABLET | Freq: Every day | ORAL | 4 refills | Status: AC
Start: 2024-06-24 — End: ?

## 2024-06-24 MED ORDER — FLUTICASONE PROPIONATE 50 MCG/ACT NA SUSP: 2.0000 | Freq: Every day | NASAL | 4 refills | Status: AC

## 2024-06-24 MED ORDER — MAGNESIUM 200 MG PO TABS: 1.0000 | ORAL_TABLET | Freq: Every day | ORAL | 4 refills | Status: AC

## 2024-06-24 MED ORDER — HYDROCORTISONE ACETATE 25 MG RE SUPP: 25.0000 mg | Freq: Two times a day (BID) | RECTAL | 0 refills | Status: AC

## 2024-06-24 MED ORDER — HYDROCHLOROTHIAZIDE 25 MG PO TABS: 25.0000 mg | ORAL_TABLET | Freq: Every day | ORAL | 4 refills | Status: AC

## 2024-06-24 MED ORDER — LEVOCETIRIZINE DIHYDROCHLORIDE 5 MG PO TABS: ORAL_TABLET | ORAL | 4 refills | Status: AC

## 2024-06-24 MED ORDER — PANTOPRAZOLE SODIUM 40 MG PO TBEC: DELAYED_RELEASE_TABLET | ORAL | 4 refills | Status: AC

## 2024-06-24 MED ORDER — MONTELUKAST SODIUM 10 MG PO TABS: 10.0000 mg | ORAL_TABLET | Freq: Every day | ORAL | 4 refills | Status: AC

## 2024-06-24 NOTE — Addendum Note (Signed)
 Addended by: SHERRE SUZEN PARAS on: 06/24/2024 09:34 AM   Modules accepted: Orders
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
## 2024-06-25 LAB — CMP14+EGFR
ALT: 21 IU/L (ref 0–32)
AST: 20 IU/L (ref 0–40)
Albumin: 4.3 g/dL (ref 3.9–4.9)
Alkaline Phosphatase: 53 IU/L (ref 41–116)
BUN/Creatinine Ratio: 15 (ref 9–23)
BUN: 11 mg/dL (ref 6–24)
Bilirubin Total: 0.3 mg/dL (ref 0.0–1.2)
CO2: 21 mmol/L (ref 20–29)
Calcium: 9.2 mg/dL (ref 8.7–10.2)
Chloride: 104 mmol/L (ref 96–106)
Creatinine, Ser: 0.74 mg/dL (ref 0.57–1.00)
Globulin, Total: 2.1 g/dL (ref 1.5–4.5)
Glucose: 98 mg/dL (ref 70–99)
Potassium: 3.8 mmol/L (ref 3.5–5.2)
Sodium: 139 mmol/L (ref 134–144)
Total Protein: 6.4 g/dL (ref 6.0–8.5)
eGFR: 100 mL/min/1.73 (ref >59)

## 2024-06-25 LAB — VITAMIN D 25 HYDROXY (VIT D DEFICIENCY, FRACTURES): Vit D, 25-Hydroxy: 22.2 ng/mL — AB (ref 30.0–100.0)

## 2024-06-25 LAB — CBC WITH DIFFERENTIAL/PLATELET
Basophils Absolute: 0 x10E3/uL (ref 0.0–0.2)
Basos: 1 %
EOS (ABSOLUTE): 0.3 x10E3/uL (ref 0.0–0.4)
Eos: 5 %
Hematocrit: 39.7 % (ref 34.0–46.6)
Hemoglobin: 13.3 g/dL (ref 11.1–15.9)
Immature Grans (Abs): 0 x10E3/uL (ref 0.0–0.1)
Immature Granulocytes: 0 %
Lymphocytes Absolute: 1.7 x10E3/uL (ref 0.7–3.1)
Lymphs: 29 %
MCH: 30.9 pg (ref 26.6–33.0)
MCHC: 33.5 g/dL (ref 31.5–35.7)
MCV: 92 fL (ref 79–97)
Monocytes Absolute: 0.4 x10E3/uL (ref 0.1–0.9)
Monocytes: 6 %
Neutrophils Absolute: 3.4 x10E3/uL (ref 1.4–7.0)
Neutrophils: 59 %
Platelets: 251 x10E3/uL (ref 150–450)
RBC: 4.31 x10E6/uL (ref 3.77–5.28)
RDW: 12.8 % (ref 11.7–15.4)
WBC: 5.7 x10E3/uL (ref 3.4–10.8)

## 2024-06-25 LAB — LIPID PANEL
Cholesterol, Total: 220 mg/dL — AB (ref 100–199)
HDL: 51 mg/dL (ref >39)
LDL CALC COMMENT:: 4.3 ratio (ref 0.0–4.4)
LDL Chol Calc (NIH): 153 mg/dL — AB (ref 0–99)
Triglycerides: 89 mg/dL (ref 0–149)
VLDL Cholesterol Cal: 16 mg/dL (ref 5–40)

## 2024-06-25 LAB — IRON,TIBC AND FERRITIN PANEL
Ferritin: 49 ng/mL (ref 15–150)
Iron Saturation: 32 % (ref 15–55)
Iron: 106 ug/dL (ref 27–159)
Total Iron Binding Capacity: 330 ug/dL (ref 250–450)
UIBC: 224 ug/dL (ref 131–425)

## 2024-06-25 LAB — TSH: TSH: 1.77 u[IU]/mL (ref 0.450–4.500)

## 2024-06-25 MED ORDER — VITAMIN D (ERGOCALCIFEROL) 1.25 MG (50000 UNIT) PO CAPS: 50000.0000 [IU] | ORAL_CAPSULE | ORAL | 3 refills | Status: AC

## 2024-07-25 ENCOUNTER — Ambulatory Visit (INDEPENDENT_AMBULATORY_CARE_PROVIDER_SITE_OTHER): Admitting: Family Medicine

## 2024-07-25 ENCOUNTER — Ambulatory Visit: Payer: Self-pay

## 2024-07-25 ENCOUNTER — Encounter: Payer: Self-pay | Admitting: Family Medicine

## 2024-07-25 VITALS — BP 129/76 | HR 59 | Temp 97.6°F | Ht 68.0 in | Wt 187.0 lb

## 2024-07-25 DIAGNOSIS — G4485 Primary stabbing headache: Secondary | ICD-10-CM

## 2024-07-25 DIAGNOSIS — J0110 Acute frontal sinusitis, unspecified: Secondary | ICD-10-CM | POA: Diagnosis not present

## 2024-07-25 MED ORDER — PSEUDOEPHEDRINE-GUAIFENESIN ER 120-1200 MG PO TB12: 1.0000 | ORAL_TABLET | Freq: Two times a day (BID) | ORAL | 99 refills | Status: DC

## 2024-07-25 MED ORDER — BETAMETHASONE SOD PHOS & ACET 6 (3-3) MG/ML IJ SUSP
6.0000 mg | Freq: Once | INTRAMUSCULAR | Status: AC
  Administered 2024-07-25: 6 mg via INTRAMUSCULAR

## 2024-07-25 MED ORDER — AMOXICILLIN-POT CLAVULANATE 875-125 MG PO TABS: 1.0000 | ORAL_TABLET | Freq: Two times a day (BID) | ORAL | 0 refills | Status: DC

## 2024-07-25 NOTE — Progress Notes (Signed)
 Chief Complaint  Patient presents with   sick    Started over the weekend. Got worse on Tuesday. Headache and green mucous. Left ear drainage. NO other symptoms. Treating with sudafed for two days.     HPI  Patient presents today for Patient presents with upper respiratory congestion. Bringing up green phlegm in AM, but denies cough through the day There is no fever, chills or sweats. The patient denies being short of breath. Onset was 3-5 days ago. Gradually worsening. Tried OTCs without improvement.  Discussed the use of AI scribe software for clinical note transcription with the patient, who gave verbal consent to proceed.  History of Present Illness Tami Johnson is a 48 year old female who presents with persistent headache and ear drainage.  She has been experiencing a persistent headache since Tuesday, which has not resolved despite taking Aleve  and ibuprofen. The headache is severe, with a pain level of 9 out of 10, and is accompanied by pressure around the left eye and pain radiating through the neck. The severity of the headache has affected her ability to work, causing her to stay home on Wednesday.  She reports drainage from her left ear, describing it as feeling like 'drinking water coming out of my ear' when lying down.  Additionally, she experiences green sputum production in the mornings, which she states is a regular occurrence for her. No cough, sore throat, or fever. No significant drainage in her throat. N  PMH: Smoking status noted Review of Systems  Constitutional:  Negative for appetite change, chills, diaphoresis, fatigue and fever.  HENT:  Positive for ear pain (left is draining). Negative for congestion, hearing loss, postnasal drip, rhinorrhea, sore throat and trouble swallowing.   Respiratory:  Negative for cough, chest tightness and shortness of breath.   Cardiovascular:  Negative for chest pain and palpitations.  Gastrointestinal:  Negative for abdominal  pain.  Musculoskeletal:  Negative for arthralgias.  Skin:  Negative for rash.  Neurological:  Positive for headaches.    Objective: BP 129/76   Pulse (!) 59   Temp 97.6 F (36.4 C)   Ht 5' 8 (1.727 m)   Wt 187 lb (84.8 kg)   SpO2 97%   BMI 28.43 kg/m  Gen: NAD, alert, cooperative with exam HEENT: NCAT, Nasal passages swollen, red TMS nml. Exquisite Left frontal sinus tenderness for percussion CV: RRR, good S1/S2, no murmur Resp: Bronchitis changes with scattered wheezes, non-labored Ext: No edema, warm Neuro: Alert and oriented, No gross deficits  Primary stabbing headache -     Pseudoephedrine -guaiFENesin  ER; Take 1 tablet by mouth 2 (two) times daily. For congestion  Dispense: 14 tablet; Refill: PRN -     Betamethasone Sod Phos & Acet  Acute non-recurrent frontal sinusitis -     Amoxicillin -Pot Clavulanate; Take 1 tablet by mouth 2 (two) times daily. Take all of this medication  Dispense: 20 tablet; Refill: 0 -     Betamethasone Sod Phos & Acet

## 2024-07-25 NOTE — Telephone Encounter (Signed)
 Apt scheduled.

## 2024-07-25 NOTE — Telephone Encounter (Signed)
 FYI Only or Action Required?: FYI only for provider: appointment scheduled on 07/25/24.  Patient was last seen in primary care on 06/24/2024 by Jolinda Norene HERO, DO.  Called Nurse Triage reporting Headache.  Symptoms began several days ago.  Interventions attempted: OTC medications: Motrin and Rest, hydration, or home remedies.  Symptoms are: gradually worsening.  Triage Disposition: See HCP Within 4 Hours (Or PCP Triage)  Patient/caregiver understands and will follow disposition?: Yes     Copied from CRM [id number]. Topic: Clinical - Red Word Triage >> Jul 25, 2024  8:03 AM Rosaria BRAVO wrote: Red Word that prompted transfer to Nurse Triage: Severe headache, head is throbbing. Cannot go to work. Discolored mucus, left side of my ear is killing me Reason for Disposition  [1] SEVERE headache (e.g., excruciating) AND [2] not improved after 2 hours of pain medicine  Answer Assessment - Initial Assessment Questions 1. LOCATION: Where does it hurt?      Forehead, down back of head into neck 2. ONSET: When did the headache start? (e.g., minutes, hours, days)      Tuesday 3. PATTERN: Does the pain come and go, or has it been constant since it started?     Intermittent 4. SEVERITY: How bad is the pain? and What does it keep you from doing?  (e.g., Scale 1-10; mild, moderate, or severe)     6/10 5. RECURRENT SYMPTOM: Have you ever had headaches before? If Yes, ask: When was the last time? and What happened that time?      Yes, usually able to get relief, this headache is not ongoing 6. CAUSE: What do you think is causing the headache?     Unknown 8. HEAD INJURY: Has there been any recent injury to your head?      None 9. OTHER SYMPTOMS: Do you have any other symptoms? (e.g., fever, stiff neck, eye pain, sore throat, cold symptoms)     Left ear pain, productive cough with green sputum  Protocols used: Headache-A-AH

## 2024-07-26 ENCOUNTER — Ambulatory Visit (INDEPENDENT_AMBULATORY_CARE_PROVIDER_SITE_OTHER): Admitting: *Deleted

## 2024-07-26 DIAGNOSIS — Z23 Encounter for immunization: Secondary | ICD-10-CM

## 2024-07-26 NOTE — Progress Notes (Signed)
 Patient is in office today for a nurse visit for Immunization. Patient Injection was given in the  Left deltoid. Patient tolerated injection well.

## 2024-08-13 ENCOUNTER — Other Ambulatory Visit: Payer: Self-pay | Admitting: Obstetrics and Gynecology

## 2024-08-13 DIAGNOSIS — Z1231 Encounter for screening mammogram for malignant neoplasm of breast: Secondary | ICD-10-CM

## 2024-09-11 ENCOUNTER — Encounter: Payer: Self-pay | Admitting: Family Medicine

## 2024-09-11 ENCOUNTER — Ambulatory Visit: Admitting: Family Medicine

## 2024-09-11 VITALS — BP 162/82 | HR 56 | Temp 98.1°F | Ht 68.0 in | Wt 197.0 lb

## 2024-09-11 DIAGNOSIS — H6691 Otitis media, unspecified, right ear: Secondary | ICD-10-CM | POA: Diagnosis not present

## 2024-09-11 DIAGNOSIS — I10 Essential (primary) hypertension: Secondary | ICD-10-CM | POA: Diagnosis not present

## 2024-09-11 DIAGNOSIS — J0101 Acute recurrent maxillary sinusitis: Secondary | ICD-10-CM | POA: Diagnosis not present

## 2024-09-11 MED ORDER — CEFDINIR 300 MG PO CAPS: 300.0000 mg | ORAL_CAPSULE | Freq: Two times a day (BID) | ORAL | 0 refills | Status: AC

## 2024-09-11 MED ORDER — CHLORPHEN-PE-ACETAMINOPHEN 4-10-325 MG PO TABS: 1.0000 | ORAL_TABLET | Freq: Four times a day (QID) | ORAL | 0 refills | Status: DC | PRN

## 2024-09-11 NOTE — Progress Notes (Signed)
 Acute Office Visit  Subjective:     Patient ID: Tami Johnson, female    DOB: [birth year], 48 y.o.   MRN: [id number]  Chief Complaint  Patient presents with   Otalgia    HPI  History of Present Illness   PAYTAN RECINE is a 48 year old female with chronic sinus issues who presents with right ear pain and sinus pressure.  She has been experiencing right ear pain for the past four days, radiating down her face and affecting her teeth. The pain is associated with sinus pressure, a frequent issue for her. She occasionally coughs up green sputum and notes clear drainage from the affected ear. No nasal congestion, sore throat, or fever.  She has a history of chronic sinus issues and was previously recommended for sinus surgery, which she has not pursued. She has been on antibiotics for similar symptoms in the past. Recently, she was prescribed amoxicillin . She also takes a generic form of Zyrtec  or Claritin  and Singulair  for allergies.  Her ear pain exacerbates her existing hearing problems, making it particularly bothersome. She previously consulted a dentist due to concerns about a root canal but was informed that her sinus issues were causing the dental pressure.  She experiences headaches localized to the side of her face with sinus pressure, particularly around the cheek and above the eye. No chest pain or shortness of breath.      ROS As per HPI.      Objective:    BP (!) 162/82   Pulse (!) 56   Temp 98.1 F (36.7 C) (Temporal)   Ht 5' 8 (1.727 m)   Wt 197 lb (89.4 kg)   SpO2 96%   BMI 29.95 kg/m  BP Readings from Last 3 Encounters:  09/11/24 (!) 162/82  07/25/24 129/76  06/24/24 125/70       Physical Exam Vitals and nursing note reviewed.  Constitutional:      General: She is not in acute distress.    Appearance: She is not ill-appearing, toxic-appearing or diaphoretic.  HENT:     Right Ear: Ear canal and external ear normal. A middle ear effusion is  present. Tympanic membrane is erythematous. Tympanic membrane is not bulging. Tympanic membrane has normal mobility.     Left Ear: Ear canal and external ear normal. A middle ear effusion is present. Tympanic membrane is not erythematous or bulging. Tympanic membrane has normal mobility.     Nose: Congestion present.     Right Sinus: Maxillary sinus tenderness present. No frontal sinus tenderness.     Left Sinus: No maxillary sinus tenderness or frontal sinus tenderness.     Mouth/Throat:     Pharynx: Oropharynx is clear.     Tonsils: No tonsillar exudate or tonsillar abscesses. 0 on the right. 0 on the left.  Eyes:     General:        Right eye: No discharge.        Left eye: No discharge.  Cardiovascular:     Rate and Rhythm: Normal rate and regular rhythm.     Pulses: Normal pulses.     Heart sounds: Normal heart sounds. No murmur heard. Pulmonary:     Effort: Pulmonary effort is normal. No respiratory distress.     Breath sounds: Normal breath sounds.  Abdominal:     General: Bowel sounds are normal. There is no distension.     Palpations: Abdomen is soft. There is no mass.  Tenderness: There is no abdominal tenderness. There is no guarding or rebound.  Musculoskeletal:     Cervical back: Neck supple. No tenderness.     Right lower leg: No edema.     Left lower leg: No edema.  Lymphadenopathy:     Cervical: No cervical adenopathy.  Skin:    General: Skin is warm and dry.  Neurological:     General: No focal deficit present.     Mental Status: She is alert and oriented to person, place, and time.  Psychiatric:        Mood and Affect: Mood normal.        Behavior: Behavior normal.     No results found for any visits on 09/11/24.      Assessment & Plan:   Margel was seen today for otalgia.  Diagnoses and all orders for this visit:  Acute right otitis media -     cefdinir  (OMNICEF ) 300 MG capsule; Take 1 capsule (300 mg total) by mouth 2 (two) times daily for 7  days. 1 po BID  Acute recurrent maxillary sinusitis -     cefdinir  (OMNICEF ) 300 MG capsule; Take 1 capsule (300 mg total) by mouth 2 (two) times daily for 7 days. 1 po BID -     Chlorphen-PE-Acetaminophen  4-10-325 MG TABS; Take 1 tablet by mouth every 6 (six) hours as needed.  Primary hypertension   Assessment and Plan    Acute right otitis media Right ear infection with pain and drainage, no fever. - Prescribed Omnicef  (cefdinir ) twice a day for one week.  Acute recurrent maxillary sinusitis Recurrent sinusitis with facial pain and pressure, no congestion. Previous ENT recommended surgery. - Prescribed Omnicef  (cefdinir ) twice a day for one week. - Recommended over-the-counter Norel AD for congestion relief. - Advised Advil for inflammation if needed.     HTN BP elevated today in office. Monitor BP at home and notify for elevated readings.   Return if symptoms worsen or fail to improve.  The patient indicates understanding of these issues and agrees with the plan.  Annabella CHRISTELLA Search, FNP

## 2024-09-18 ENCOUNTER — Ambulatory Visit: Payer: Self-pay

## 2024-09-18 NOTE — Telephone Encounter (Addendum)
 FYI Only or Action Required?: Action required by provider: clinical question for provider and update on patient condition.  Patient was last seen in primary care on 09/11/2024 by Joesph Annabella HERO, FNP.  Called Nurse Triage reporting Otalgia.  Symptoms began several days ago.  Interventions attempted: Prescription medications: Cefdinir  (OMNICEF ),  Chlorphen-PE-Acetaminophen , ibuprofen.  Symptoms are: unchanged.  Triage Disposition: See Physician Within 24 Hours  Patient/caregiver understands and will follow disposition?: Yes            Copied from CRM #[id number]. Topic: Clinical - Red Word Triage >> Sep 18, 2024  8:34 AM Donna BRAVO wrote: Red Word that prompted transfer to Nurse Triage:  Appt 09/11/24 Medication has not helped still having right ear pain Sharp pain Reason for Disposition  Earache  (Exceptions: Brief ear pain of lasting less than 60 minutes, or earache occurring during air travel.)  Answer Assessment - Initial Assessment Questions 1. LOCATION: Which ear is involved?     Right ear pain   2. ONSET: When did the ear pain start?      Several days   3. SEVERITY: How bad is the pain?  (Scale 1-10; mild, moderate or severe)     Moderate  4. URI SYMPTOMS: Do you have a runny nose or cough?     No    5. FEVER: Do you have a fever? If Yes, ask: What is your temperature, how was it measured, and when did it start?     No        7. OTHER SYMPTOMS: Do you have any other symptoms? (e.g., decreased hearing, dizziness, headache, stiff neck, vomiting)     No    Patient called in to triage with complaints of right ear pain. She was seen in office on 09/11/24 for similar symptoms/sinus infection. This has been ongoing for several days The patient stated she will  complete the cefdinir  (OMNICEF ) today. The Chlorphen-PE-Acetaminophen  is not providing relief.  For home care, the patient is taking OTC ibuprofen.   Appointment for further evaluation  declined; patient would like different medications prescribed/different treatment plan. Please advise  Protocols used: Rilla

## 2024-09-20 NOTE — Telephone Encounter (Signed)
 Left message for patient to call back, following up on symptoms.

## 2024-09-24 ENCOUNTER — Encounter: Payer: Self-pay | Admitting: Nurse Practitioner

## 2024-09-24 ENCOUNTER — Ambulatory Visit: Payer: Self-pay

## 2024-09-24 ENCOUNTER — Telehealth: Admitting: Nurse Practitioner

## 2024-09-24 DIAGNOSIS — J111 Influenza due to unidentified influenza virus with other respiratory manifestations: Secondary | ICD-10-CM | POA: Diagnosis not present

## 2024-09-24 MED ORDER — OSELTAMIVIR PHOSPHATE 75 MG PO CAPS: 75.0000 mg | ORAL_CAPSULE | Freq: Two times a day (BID) | ORAL | 0 refills | Status: DC

## 2024-09-24 NOTE — Progress Notes (Signed)
 "   Virtual Visit Consent   Tami Johnson, you are scheduled for a virtual visit with Mary-Margaret Gladis, FNP, a [facility] provider, today.     Just as with appointments in the office, your consent must be obtained to participate.  Your consent will be active for this visit and any virtual visit you may have with one of our providers in the next 365 days.     If you have a MyChart account, a copy of this consent can be sent to you electronically.  All virtual visits are billed to your insurance company just like a traditional visit in the office.    As this is a virtual visit, video technology does not allow for your provider to perform a traditional examination.  This may limit your provider's ability to fully assess your condition.  If your provider identifies any concerns that need to be evaluated in person or the need to arrange testing (such as labs, EKG, etc.), we will make arrangements to do so.     Although advances in technology are sophisticated, we cannot ensure that it will always work on either your end or our end.  If the connection with a video visit is poor, the visit may have to be switched to a telephone visit.  With either a video or telephone visit, we are not always able to ensure that we have a secure connection.     I need to obtain your verbal consent now.   Are you willing to proceed with your visit today? YES   Tami Johnson has provided verbal consent on 09/24/2024 for a virtual visit (video or telephone).   Mary-Margaret Gladis, FNP   Date: 09/24/2024 12:20 PM   Virtual Visit via Video Note   I, Mary-Margaret Gladis, connected with Tami Johnson ([id number], 1976-07-15) on 09/24/2024 at  5:15 PM EST by a video-enabled telemedicine application and verified that I am speaking with the correct person using two identifiers.  Location: Patient: Virtual Visit Location Patient: Home Provider: Virtual Visit Location Provider: Mobile   I discussed the  limitations of evaluation and management by telemedicine and the availability of in person appointments. The patient expressed understanding and agreed to proceed.    History of Present Illness: Tami Johnson is a 48 y.o. who identifies as a female who was assigned female at birth, and is being seen today for influenza.  HPI: Husband and grand kids have flu.  Influenza This is a new problem. The current episode started yesterday. The problem occurs constantly. The problem has been gradually worsening. Associated symptoms include coughing, fatigue, a fever (101.3), headaches, myalgias and a sore throat. Pertinent negatives include no chills. She has tried acetaminophen  and NSAIDs for the symptoms. The treatment provided mild relief.    Review of Systems  Constitutional:  Positive for fatigue and fever (101.3). Negative for chills.  HENT:  Positive for sore throat.   Respiratory:  Positive for cough.   Musculoskeletal:  Positive for myalgias.  Neurological:  Positive for headaches.    Problems:  Patient Active Problem List   Diagnosis Date Noted   Obesity (BMI 30.0-34.9) 06/23/2023   Menorrhagia 07/11/2022   Pre-diabetes 06/06/2022   Pure hypercholesterolemia 04/13/2021   Chronic sinusitis 03/10/2020   Vitamin D  deficiency 10/01/2019   PAC (premature atrial contraction) 08/25/2014   Primary hypertension 08/25/2014   Breast lump 10/27/2013    Allergies: Allergies[1] Medications: Current Medications[2]  Observations/Objective: Patient is well-developed, well-nourished in no acute  distress.  Resting comfortably  at home.  Head is normocephalic, atraumatic.  No labored breathing.  Speech is clear and coherent with logical content.  Patient is alert and oriented at baseline.  Face flushed Deep cough  Assessment and Plan:  Tami Johnson in today with chief complaint of No chief complaint on file.   1. Influenza (Primary) 1. Take meds as prescribed 2. Use a cool  mist humidifier especially during the winter months and when heat has been humid. 3. Use saline nose sprays frequently 4. Saline irrigations of the nose can be very helpful if done frequently.  * 4X daily for 1 week*  * Use of a nettie pot can be helpful with this. Follow directions with this* 5. Drink plenty of fluids 6. Keep thermostat turn down low 7.For any cough or congestion- mucinex  8. For fever or aces or pains- take tylenol  or ibuprofen appropriate for age and weight.  * for fevers greater than 101 orally you may alternate ibuprofen and tylenol  every  3 hours.    Meds ordered this encounter  Medications   oseltamivir  (TAMIFLU ) 75 MG capsule    Sig: Take 1 capsule (75 mg total) by mouth 2 (two) times daily.    Dispense:  10 capsule    Refill:  0    Supervising Provider:   MARYANNE CHEW A [[id number]]         Follow Up Instructions: I discussed the assessment and treatment plan with the patient. The patient was provided an opportunity to ask questions and all were answered. The patient agreed with the plan and demonstrated an understanding of the instructions.  A copy of instructions were sent to the patient via MyChart.  The patient was advised to call back or seek an in-person evaluation if the symptoms worsen or if the condition fails to improve as anticipated.  Time:  I spent 10 minutes with the patient via telehealth technology discussing the above problems/concerns.    Mary-Margaret Gladis, FNP    [1]  Allergies Allergen Reactions   Ciprofloxacin Hives   Sulfa Antibiotics Hives, Other (See Comments) and Itching   Sulfamethoxazole-Trimethoprim Other (See Comments), Itching and Rash   Hydrocodone-Acetaminophen  Other (See Comments) and Cough  [2]  Current Outpatient Medications:    Chlorphen-PE-Acetaminophen  4-10-325 MG TABS, Take 1 tablet by mouth every 6 (six) hours as needed., Disp: 20 tablet, Rfl: 0   ferrous sulfate  325 (65 FE) MG tablet, Take 1 tablet  (325 mg total) by mouth daily with breakfast., Disp: 90 tablet, Rfl: 4   fluticasone  (FLONASE ) 50 MCG/ACT nasal spray, Place 2 sprays into both nostrils daily., Disp: 48 g, Rfl: 4   hydrochlorothiazide  (HYDRODIURIL ) 25 MG tablet, Take 1 tablet (25 mg total) by mouth daily., Disp: 90 tablet, Rfl: 4   hydrocortisone  (ANUSOL -HC) 2.5 % rectal cream, Place 1 Application rectally 2 (two) times daily. X5-7 days as needed for hemorrhoid flareup, Disp: 30 g, Rfl: 0   hydrocortisone  (ANUSOL -HC) 25 MG suppository, Place 1 suppository (25 mg total) rectally 2 (two) times daily., Disp: 12 suppository, Rfl: 0   levocetirizine (XYZAL ) 5 MG tablet, TAKE 1 TABLET BY MOUTH ONCE EVERY EVENING For allergies, Disp: 90 tablet, Rfl: 4   Magnesium  200 MG TABS, Take 1 tablet (200 mg total) by mouth daily., Disp: 90 tablet, Rfl: 4   metoprolol  succinate (TOPROL -XL) 25 MG 24 hr tablet, Take 0.5 tablets (12.5 mg total) by mouth daily., Disp: 45 tablet, Rfl: 4   montelukast  (SINGULAIR ) 10 MG  tablet, Take 1 tablet (10 mg total) by mouth at bedtime., Disp: 90 tablet, Rfl: 4   pantoprazole  (PROTONIX ) 40 MG tablet, TAKE 1 TABLET BY MOUTH EVERY DAY (40 MG TOTAL) for acid reflux, Disp: 90 tablet, Rfl: 4   Vitamin D , Ergocalciferol , (DRISDOL ) 1.25 MG (50000 UNIT) CAPS capsule, Take 1 capsule (50,000 Units total) by mouth every 7 (seven) days., Disp: 12 capsule, Rfl: 3  "

## 2024-09-24 NOTE — Patient Instructions (Signed)

## 2024-09-24 NOTE — Telephone Encounter (Signed)
 This RN contacted patient, she stated she already has a same day OV scheduled.

## 2024-09-27 ENCOUNTER — Ambulatory Visit: Payer: Self-pay

## 2024-09-27 NOTE — Telephone Encounter (Signed)
 Appointment scheduled with Ronal Lunger for Monday 1/5 at 3:15.

## 2024-09-27 NOTE — Telephone Encounter (Signed)
 FYI Only or Action Required?: Action required by provider: No availability, advised UC, needs call back to pt with further recommendations and/or meds if appropriate.  Patient was last seen in primary care on 09/24/2024 by Gladis Mustard, FNP.  Called Nurse Triage reporting Ear Pain.  Symptoms began right ear infection on 12/17 improved now pain back for past several days.  Interventions attempted: OTC medications: ibuprofen, Prescription medications: antibx completed form 12/17, Rest, hydration, or home remedies, and Ice/heat application.  Symptoms are: gradually worsening.  Triage Disposition: See Physician Within 24 Hours  Patient/caregiver understands and will follow disposition?: Yes      Message from [facility] sent at 09/27/2024  8:06 AM EST  Summary: Worsing in her right ear   Reason for Triage: Patient is having some trouble with her right ear. She was seen for it on the 17th but its now getting worse and its hurting. Its a constant pain. Could you assist? Patients callback number is [phone].         Reason for Disposition  Earache  (Exceptions: Brief ear pain of lasting less than 60 minutes, or earache occurring during air travel.)  Answer Assessment - Initial Assessment Questions This RN recommended pt be examined today for symptoms, no availability, advised UC today. Sending message to PCP for call back to pt with further recommendations and/or meds if appropriate.   Comes and goes Get sharp pain in ear Have bad hearing already Right ear is hurting Had been going all in right side headache teeth 12/17 gave meds for right ear infection, took all of it, a couple days after completed meds good Got flu, now pain back Throbs No fever or stiff neck Not pink or swollen behind ear Clear discharge, dries up and crusty feeling No blood in discharge No head/face injury Got woke up by the pain Put warm rag on it, helped little bit enough to go to sleep No  dizziness Ibuprofen 7/10 pain, last night 9/10 Not really feeling any pain right now  Protocols used: Earache-A-AH

## 2024-09-30 ENCOUNTER — Encounter: Payer: Self-pay | Admitting: Nurse Practitioner

## 2024-09-30 ENCOUNTER — Ambulatory Visit: Admitting: Nurse Practitioner

## 2024-09-30 ENCOUNTER — Ambulatory Visit: Payer: Self-pay | Admitting: Audiologist

## 2024-09-30 VITALS — BP 149/90 | HR 62 | Temp 97.8°F | Ht 68.0 in | Wt 193.0 lb

## 2024-09-30 DIAGNOSIS — H9203 Otalgia, bilateral: Secondary | ICD-10-CM | POA: Diagnosis not present

## 2024-09-30 MED ORDER — MULTIVITAMIN WOMEN PO TABS: 1.0000 | ORAL_TABLET | Freq: Every day | ORAL | 5 refills | Status: AC

## 2024-09-30 MED ORDER — KETOROLAC TROMETHAMINE 60 MG/2ML IM SOLN
60.0000 mg | Freq: Once | INTRAMUSCULAR | Status: AC
  Administered 2024-09-30: 60 mg via INTRAMUSCULAR

## 2024-09-30 MED ORDER — METHYLPREDNISOLONE ACETATE 80 MG/ML IJ SUSP
80.0000 mg | Freq: Once | INTRAMUSCULAR | Status: AC
  Administered 2024-09-30: 80 mg via INTRAMUSCULAR

## 2024-09-30 NOTE — Patient Instructions (Signed)
Earache, Adult An earache, or ear pain, can be caused by many things, including: An infection. Ear wax buildup. Ear pressure. Something in the ear that should not be there (foreign body). A sore throat. Tooth problems. Jaw problems. Treatment of the earache will depend on the cause. If the cause is not clear or cannot be known, you may need to watch your symptoms until your earache goes away or until a cause is found. Follow these instructions at home: Medicines Take or apply over-the-counter and prescription medicines only as told by your health care provider. If you were prescribed antibiotics, use them as told by your health care provider. Do not stop using the antibiotic even if you start to feel better. Do not put anything in your ear other than medicine that is prescribed by your health care provider. Managing pain     If directed, apply heat to the affected area as often as told by your health care provider. Use the heat source that your health care provider recommends, such as a moist heat pack or a heating pad. Place a towel between your skin and the heat source. Leave the heat on for 20-30 minutes. If your skin turns bright red, remove the heat right away to prevent burns. The risk of burns is higher if you cannot feel pain, heat, or cold. If directed, put ice on the affected area. To do this: Put ice in a plastic bag. Place a towel between your skin and the bag. Leave the ice on for 20 minutes, 2-3 times a day. If your skin turns bright red, remove the ice right away to prevent skin damage. The risk of skin damage is higher if you cannot feel pain, heat, or cold.  General instructions Pay attention to any changes in your symptoms. Try resting in an upright position instead of lying down. This may help to reduce pressure in your ear and relieve pain. Chew gum if it helps to relieve your ear pain. Treat any allergies as told by your health care provider. Drink enough fluid  to keep your urine pale yellow. It is up to you to get the results of any tests that were done. Ask your health care provider, or the department that is doing the tests, when your results will be ready. Contact a health care provider if: Your pain does not improve within 2 days. Your earache gets worse. You have new symptoms. You have a fever. Get help right away if: You have a severe headache. You have a stiff neck. You have trouble swallowing. You have redness or swelling behind your ear. You have fluid or blood coming from your ear. You have hearing loss. You feel dizzy. This information is not intended to replace advice given to you by your health care provider. Make sure you discuss any questions you have with your health care provider. Document Revised: 01/24/2022 Document Reviewed: 01/24/2022 Elsevier Patient Education  2024 [employer].  

## 2024-09-30 NOTE — Progress Notes (Signed)
" ° °  Subjective:    Patient ID: Tami Johnson, female    DOB: 28-Apr-1976, 49 y.o.   MRN: [id number]   Chief Complaint: Ear Pain (Right ear. Just got over the flu)   Patient just got over the flu and is now c/o ear pain  Otalgia  There is pain in the left ear. This is a new problem. The problem occurs constantly. The problem has been gradually worsening. There has been no fever. The pain is at a severity of 5/10 (currently). Associated symptoms include rhinorrhea. Pertinent negatives include no coughing or ear discharge. She has tried acetaminophen  for the symptoms. The treatment provided mild relief.     Patient Active Problem List   Diagnosis Date Noted   Obesity (BMI 30.0-34.9) 06/23/2023   Menorrhagia 07/11/2022   Pre-diabetes 06/06/2022   Pure hypercholesterolemia 04/13/2021   Chronic sinusitis 03/10/2020   Vitamin D  deficiency 10/01/2019   PAC (premature atrial contraction) 08/25/2014   Primary hypertension 08/25/2014   Breast lump 10/27/2013       Review of Systems  Constitutional:  Negative for chills and fever.  HENT:  Positive for ear pain and rhinorrhea. Negative for ear discharge.   Respiratory:  Negative for cough.        Objective:   Physical Exam Constitutional:      Appearance: Normal appearance.  HENT:     Right Ear: Tympanic membrane normal.     Left Ear: Tympanic membrane normal.     Nose: Congestion present. No rhinorrhea.  Cardiovascular:     Rate and Rhythm: Normal rate and regular rhythm.     Heart sounds: Normal heart sounds.  Pulmonary:     Effort: Pulmonary effort is normal.     Breath sounds: Normal breath sounds.  Skin:    General: Skin is warm.  Neurological:     General: No focal deficit present.     Mental Status: She is alert and oriented to person, place, and time.  Psychiatric:        Behavior: Behavior normal.    BP (!) 149/90   Pulse 62   Temp 97.8 F (36.6 C) (Temporal)   Ht 5' 8 (1.727 m)   Wt 193 lb (87.5 kg)    SpO2 97%   BMI 29.35 kg/m         Assessment & Plan:   Tami Johnson in today with chief complaint of Ear Pain (Right ear. Just got over the flu)   1. Otalgia of both ears (Primary) Rest Force fluids RTO prn - methylPREDNISolone  acetate (DEPO-MEDROL ) injection 80 mg - ketorolac  (TORADOL ) injection 60 mg    The above assessment and management plan was discussed with the patient. The patient verbalized understanding of and has agreed to the management plan. Patient is aware to call the clinic if symptoms persist or worsen. Patient is aware when to return to the clinic for a follow-up visit. Patient educated on when it is appropriate to go to the emergency department.   Mary-Margaret Gladis, FNP   "

## 2024-10-03 NOTE — Telephone Encounter (Signed)
 Patient has been seen by Ronal Rollene Lunger, FNP 09/30/2024 for otalgia.

## 2024-10-07 ENCOUNTER — Ambulatory Visit
Admission: RE | Admit: 2024-10-07 | Discharge: 2024-10-07 | Disposition: A | Source: Ambulatory Visit | Attending: Obstetrics and Gynecology | Admitting: Obstetrics and Gynecology

## 2024-10-07 DIAGNOSIS — Z1231 Encounter for screening mammogram for malignant neoplasm of breast: Secondary | ICD-10-CM

## 2024-10-09 ENCOUNTER — Encounter: Payer: Self-pay | Admitting: Audiologist

## 2024-10-09 ENCOUNTER — Ambulatory Visit: Attending: Family Medicine | Admitting: Audiologist

## 2024-10-09 DIAGNOSIS — H905 Unspecified sensorineural hearing loss: Secondary | ICD-10-CM | POA: Diagnosis present

## 2024-10-09 NOTE — Procedures (Signed)
" °  Outpatient Audiology and [facility] [address] [place], [place]  [address] [phone]  AUDIOLOGICAL  EVALUATION  NAME: Tami Johnson     DOB:   10/23/1975      MRN: [id number]                                                                                     DATE: 10/09/2024     REFERENT: Jolinda Norene HERO, DO STATUS: Outpatient DIAGNOSIS: Sensorineural Hearing Loss, Bilateral    History: Tami Johnson was seen for an audiological evaluation due to a lifelong hearing loss. Tami Johnson was diagnosed as a child with hearing loss. She used to see Dr. Ethyl, Otolaryngologist. She had hearing aids and na FM system while in school but stopped wearing aids once she graduated high school. She feels her hearing has gotten worse the last few years. She is struggling more at work and in noise. She has a grandchild now and is ready to hear again.  Tami Johnson denies pain, pressure, or tinnitus.  Tami Johnson has no history of hazardous noise exposure.  Medical history shows no other risk for hearing loss. She did have ear pain due to recent illness, this has subsided.    Evaluation:  Otoscopy showed a clear view of the tympanic membranes, bilaterally Tympanometry results were consistent with normal middle ear function, bilaterally   Audiometric testing was completed using Conventional Audiometry techniques with insert earphones and supraural headphones. 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 See audiogram for threshold details. Speech Recognition Thresholds were obtained at 45dB HL in the right ear and at 50dB HL in the left ear. Word Recognition Testing was completed at  40dB SL and Tami Johnson scored 88% in the right ear and 96% in the left ear.    Results:  The test results were reviewed with Tami Johnson and her husband. Tami Johnson has a moderately severe cookie bite sensorineural hearing loss. She needs to use hearing aid regularly. She  recognizes the degree and nature of the hearing loss on the audiogram.  Audiogram printed and provided to [facility].    Recommendations: Hearing aids recommended for both ears. Patient given list of local hearing aid providers. Call [employer] for hearing aid benefit.  Annual audiometric testing recommended to monitor hearing loss for progression.     30 minutes spent testing and counseling on results.   If you have any questions please feel free to contact me at [phone].  Lauraine Ka Stalnaker Au.D.  Audiologist   10/09/2024  3:19 PM  Cc: Jolinda Norene HERO, DO  "

## 2025-06-24 ENCOUNTER — Encounter: Payer: Self-pay | Admitting: Family Medicine

## 2025-06-25 ENCOUNTER — Encounter: Admitting: Family Medicine
